# Patient Record
Sex: Female | Born: 1985
Health system: Southern US, Community
[De-identification: ages and names within clinical notes are randomized; demographics above are authoritative.]

## PROBLEM LIST (undated history)

## (undated) DIAGNOSIS — F419 Anxiety disorder, unspecified: Secondary | ICD-10-CM

## (undated) DIAGNOSIS — F988 Other specified behavioral and emotional disorders with onset usually occurring in childhood and adolescence: Secondary | ICD-10-CM

## (undated) HISTORY — DX: Other specified behavioral and emotional disorders with onset usually occurring in childhood and adolescence: F98.8

---
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
## 2006-02-16 ENCOUNTER — Ambulatory Visit: Payer: Self-pay | Admitting: Family Medicine

## 2006-06-28 ENCOUNTER — Ambulatory Visit: Payer: Self-pay | Admitting: Family Medicine

## 2006-11-08 DIAGNOSIS — F988 Other specified behavioral and emotional disorders with onset usually occurring in childhood and adolescence: Secondary | ICD-10-CM | POA: Insufficient documentation

## 2007-02-08 ENCOUNTER — Encounter: Payer: Self-pay | Admitting: Family Medicine

## 2007-02-08 ENCOUNTER — Other Ambulatory Visit: Admission: RE | Admit: 2007-02-08 | Discharge: 2007-02-08 | Payer: Self-pay | Admitting: Family Medicine

## 2007-02-08 ENCOUNTER — Ambulatory Visit: Payer: Self-pay | Admitting: Family Medicine

## 2007-02-08 DIAGNOSIS — R82998 Other abnormal findings in urine: Secondary | ICD-10-CM | POA: Insufficient documentation

## 2007-02-08 LAB — CONVERTED CEMR LAB
Beta hcg, urine, semiquantitative: NEGATIVE
Bilirubin Urine: NEGATIVE
Blood in Urine, dipstick: NEGATIVE
Glucose, Urine, Semiquant: NEGATIVE
Ketones, urine, test strip: NEGATIVE
Nitrite: NEGATIVE
Protein, U semiquant: NEGATIVE
Specific Gravity, Urine: 1.015
Urobilinogen, UA: NEGATIVE
pH: 6

## 2007-02-11 ENCOUNTER — Encounter (INDEPENDENT_AMBULATORY_CARE_PROVIDER_SITE_OTHER): Payer: Self-pay | Admitting: *Deleted

## 2007-02-14 ENCOUNTER — Ambulatory Visit: Payer: Self-pay | Admitting: Family Medicine

## 2007-02-18 ENCOUNTER — Telehealth (INDEPENDENT_AMBULATORY_CARE_PROVIDER_SITE_OTHER): Payer: Self-pay | Admitting: *Deleted

## 2007-02-18 LAB — CONVERTED CEMR LAB

## 2007-02-21 LAB — CONVERTED CEMR LAB
ALT: 10 units/L (ref 0–35)
AST: 17 units/L (ref 0–37)
Albumin: 3.4 g/dL — ABNORMAL LOW (ref 3.5–5.2)
Alkaline Phosphatase: 36 units/L — ABNORMAL LOW (ref 39–117)
BUN: 7 mg/dL (ref 6–23)
Basophils Absolute: 0 10*3/uL (ref 0.0–0.1)
Basophils Relative: 0.4 % (ref 0.0–1.0)
Bilirubin, Direct: 0.1 mg/dL (ref 0.0–0.3)
CO2: 30 meq/L (ref 19–32)
Calcium: 8.9 mg/dL (ref 8.4–10.5)
Chloride: 106 meq/L (ref 96–112)
Cholesterol: 200 mg/dL (ref 0–200)
Creatinine, Ser: 0.8 mg/dL (ref 0.4–1.2)
Eosinophils Absolute: 0.1 10*3/uL (ref 0.0–0.6)
Eosinophils Relative: 0.8 % (ref 0.0–5.0)
GFR calc Af Amer: 118 mL/min
GFR calc non Af Amer: 97 mL/min
Glucose, Bld: 95 mg/dL (ref 70–99)
HCT: 37.2 % (ref 36.0–46.0)
HDL: 62.8 mg/dL (ref >39.0)
Hemoglobin: 13 g/dL (ref 12.0–15.0)
LDL Cholesterol: 126 mg/dL — ABNORMAL HIGH (ref 0–99)
Lymphocytes Relative: 21.1 % (ref 12.0–46.0)
MCHC: 34.9 g/dL (ref 30.0–36.0)
MCV: 89.7 fL (ref 78.0–100.0)
Monocytes Absolute: 0.4 10*3/uL (ref 0.2–0.7)
Monocytes Relative: 4.1 % (ref 3.0–11.0)
Neutro Abs: 7.5 10*3/uL (ref 1.4–7.7)
Neutrophils Relative %: 73.6 % (ref 43.0–77.0)
Platelets: 264 10*3/uL (ref 150–400)
Potassium: 4.2 meq/L (ref 3.5–5.1)
RBC: 4.14 M/uL (ref 3.87–5.11)
RDW: 11.6 % (ref 11.5–14.6)
Sodium: 142 meq/L (ref 135–145)
TSH: 2.82 microintl units/mL (ref 0.35–5.50)
Total Bilirubin: 0.7 mg/dL (ref 0.3–1.2)
Total CHOL/HDL Ratio: 3.2
Total Protein: 6.5 g/dL (ref 6.0–8.3)
Triglycerides: 58 mg/dL (ref 0–149)
VLDL: 12 mg/dL (ref 0–40)
WBC: 10.2 10*3/uL (ref 4.5–10.5)

## 2007-04-18 ENCOUNTER — Telehealth (INDEPENDENT_AMBULATORY_CARE_PROVIDER_SITE_OTHER): Payer: Self-pay | Admitting: *Deleted

## 2007-04-19 ENCOUNTER — Ambulatory Visit: Payer: Self-pay | Admitting: Family Medicine

## 2007-04-19 DIAGNOSIS — N390 Urinary tract infection, site not specified: Secondary | ICD-10-CM | POA: Insufficient documentation

## 2007-04-19 LAB — CONVERTED CEMR LAB
Beta hcg, urine, semiquantitative: NEGATIVE
Bilirubin Urine: NEGATIVE
Glucose, Urine, Semiquant: NEGATIVE
Ketones, urine, test strip: NEGATIVE
Nitrite: NEGATIVE
Protein, U semiquant: NEGATIVE
Specific Gravity, Urine: 1.015
Urobilinogen, UA: NEGATIVE
pH: 6.5

## 2007-04-22 ENCOUNTER — Encounter (INDEPENDENT_AMBULATORY_CARE_PROVIDER_SITE_OTHER): Payer: Self-pay | Admitting: *Deleted

## 2007-07-18 ENCOUNTER — Ambulatory Visit: Payer: Self-pay | Admitting: Internal Medicine

## 2007-07-18 DIAGNOSIS — L509 Urticaria, unspecified: Secondary | ICD-10-CM | POA: Insufficient documentation

## 2008-02-19 ENCOUNTER — Telehealth (INDEPENDENT_AMBULATORY_CARE_PROVIDER_SITE_OTHER): Payer: Self-pay | Admitting: *Deleted

## 2008-04-02 ENCOUNTER — Other Ambulatory Visit: Admission: RE | Admit: 2008-04-02 | Discharge: 2008-04-02 | Payer: Self-pay | Admitting: Family Medicine

## 2008-04-02 ENCOUNTER — Encounter: Payer: Self-pay | Admitting: Family Medicine

## 2008-04-02 ENCOUNTER — Ambulatory Visit: Payer: Self-pay | Admitting: Family Medicine

## 2008-04-02 LAB — CONVERTED CEMR LAB
Bilirubin Urine: NEGATIVE
Glucose, Urine, Semiquant: NEGATIVE
Ketones, urine, test strip: NEGATIVE
Nitrite: NEGATIVE
Protein, U semiquant: NEGATIVE
Specific Gravity, Urine: 1.025
Urobilinogen, UA: NEGATIVE
pH: 5

## 2008-04-03 ENCOUNTER — Ambulatory Visit: Payer: Self-pay | Admitting: Family Medicine

## 2008-04-03 LAB — CONVERTED CEMR LAB

## 2008-04-06 ENCOUNTER — Encounter (INDEPENDENT_AMBULATORY_CARE_PROVIDER_SITE_OTHER): Payer: Self-pay | Admitting: *Deleted

## 2008-04-09 ENCOUNTER — Telehealth (INDEPENDENT_AMBULATORY_CARE_PROVIDER_SITE_OTHER): Payer: Self-pay | Admitting: *Deleted

## 2008-04-09 ENCOUNTER — Encounter (INDEPENDENT_AMBULATORY_CARE_PROVIDER_SITE_OTHER): Payer: Self-pay | Admitting: *Deleted

## 2008-04-13 LAB — CONVERTED CEMR LAB
ALT: 12 units/L (ref 0–35)
AST: 19 units/L (ref 0–37)
Albumin: 3.7 g/dL (ref 3.5–5.2)
Alkaline Phosphatase: 31 units/L — ABNORMAL LOW (ref 39–117)
BUN: 8 mg/dL (ref 6–23)
Basophils Absolute: 0 10*3/uL (ref 0.0–0.1)
Basophils Relative: 0.2 % (ref 0.0–3.0)
Bilirubin, Direct: 0.1 mg/dL (ref 0.0–0.3)
CO2: 28 meq/L (ref 19–32)
Calcium: 8.9 mg/dL (ref 8.4–10.5)
Chloride: 103 meq/L (ref 96–112)
Cholesterol: 222 mg/dL (ref 0–200)
Creatinine, Ser: 0.8 mg/dL (ref 0.4–1.2)
Direct LDL: 168 mg/dL
Eosinophils Absolute: 0 10*3/uL (ref 0.0–0.7)
Eosinophils Relative: 0.5 % (ref 0.0–5.0)
GFR calc Af Amer: 116 mL/min
GFR calc non Af Amer: 96 mL/min
Glucose, Bld: 89 mg/dL (ref 70–99)
HCT: 42.2 % (ref 36.0–46.0)
HDL: 54.1 mg/dL (ref >39.0)
Hemoglobin: 14 g/dL (ref 12.0–15.0)
Lymphocytes Relative: 19 % (ref 12.0–46.0)
MCHC: 33.2 g/dL (ref 30.0–36.0)
MCV: 93.6 fL (ref 78.0–100.0)
Monocytes Absolute: 0.4 10*3/uL (ref 0.1–1.0)
Monocytes Relative: 4.2 % (ref 3.0–12.0)
Neutro Abs: 7.1 10*3/uL (ref 1.4–7.7)
Neutrophils Relative %: 76.1 % (ref 43.0–77.0)
Platelets: 251 10*3/uL (ref 150–400)
Potassium: 3.8 meq/L (ref 3.5–5.1)
RBC: 4.5 M/uL (ref 3.87–5.11)
RDW: 12.4 % (ref 11.5–14.6)
Sodium: 139 meq/L (ref 135–145)
TSH: 0.95 microintl units/mL (ref 0.35–5.50)
Total Bilirubin: 0.7 mg/dL (ref 0.3–1.2)
Total CHOL/HDL Ratio: 4.1
Total Protein: 7.3 g/dL (ref 6.0–8.3)
Triglycerides: 111 mg/dL (ref 0–149)
VLDL: 22 mg/dL (ref 0–40)
WBC: 9.2 10*3/uL (ref 4.5–10.5)

## 2008-04-14 ENCOUNTER — Encounter (INDEPENDENT_AMBULATORY_CARE_PROVIDER_SITE_OTHER): Payer: Self-pay | Admitting: *Deleted

## 2008-05-14 ENCOUNTER — Ambulatory Visit: Payer: Self-pay | Admitting: Family Medicine

## 2008-09-15 ENCOUNTER — Ambulatory Visit: Payer: Self-pay | Admitting: Family Medicine

## 2008-09-15 DIAGNOSIS — J020 Streptococcal pharyngitis: Secondary | ICD-10-CM | POA: Insufficient documentation

## 2009-01-27 ENCOUNTER — Ambulatory Visit: Payer: Self-pay | Admitting: Family Medicine

## 2009-01-27 DIAGNOSIS — N760 Acute vaginitis: Secondary | ICD-10-CM | POA: Insufficient documentation

## 2009-01-27 LAB — CONVERTED CEMR LAB
Beta hcg, urine, semiquantitative: NEGATIVE
Bilirubin Urine: NEGATIVE
Glucose, Urine, Semiquant: NEGATIVE
Ketones, urine, test strip: NEGATIVE
Nitrite: POSITIVE
Protein, U semiquant: 30
Specific Gravity, Urine: 1.01
Urobilinogen, UA: NEGATIVE
Whiff Test: POSITIVE
pH: 6

## 2009-01-28 ENCOUNTER — Encounter: Payer: Self-pay | Admitting: Family Medicine

## 2009-02-02 LAB — CONVERTED CEMR LAB
Chlamydia, DNA Probe: POSITIVE — AB
GC Probe Amp, Genital: NEGATIVE

## 2009-02-05 ENCOUNTER — Ambulatory Visit: Payer: Self-pay | Admitting: Family Medicine

## 2009-02-05 DIAGNOSIS — A5609 Other chlamydial infection of lower genitourinary tract: Secondary | ICD-10-CM | POA: Insufficient documentation

## 2009-02-19 ENCOUNTER — Ambulatory Visit: Payer: Self-pay | Admitting: Family Medicine

## 2009-02-24 ENCOUNTER — Encounter (INDEPENDENT_AMBULATORY_CARE_PROVIDER_SITE_OTHER): Payer: Self-pay | Admitting: *Deleted

## 2009-02-24 LAB — CONVERTED CEMR LAB
Chlamydia, Swab/Urine, PCR: NEGATIVE
GC Probe Amp, Urine: NEGATIVE

## 2009-06-14 ENCOUNTER — Telehealth (INDEPENDENT_AMBULATORY_CARE_PROVIDER_SITE_OTHER): Payer: Self-pay | Admitting: *Deleted

## 2009-07-27 ENCOUNTER — Ambulatory Visit: Payer: Self-pay | Admitting: Family Medicine

## 2009-07-27 ENCOUNTER — Other Ambulatory Visit: Admission: RE | Admit: 2009-07-27 | Discharge: 2009-07-27 | Payer: Self-pay | Admitting: Family Medicine

## 2009-07-27 DIAGNOSIS — R259 Unspecified abnormal involuntary movements: Secondary | ICD-10-CM | POA: Insufficient documentation

## 2009-07-27 LAB — CONVERTED CEMR LAB
Bilirubin Urine: NEGATIVE
Glucose, Urine, Semiquant: NEGATIVE
Ketones, urine, test strip: NEGATIVE
Nitrite: NEGATIVE
Protein, U semiquant: NEGATIVE
Specific Gravity, Urine: 1.01
Urobilinogen, UA: NEGATIVE
pH: 6

## 2009-07-28 ENCOUNTER — Encounter: Payer: Self-pay | Admitting: Family Medicine

## 2009-07-28 LAB — CONVERTED CEMR LAB: Anti Nuclear Antibody(ANA): NEGATIVE

## 2009-07-29 ENCOUNTER — Encounter (INDEPENDENT_AMBULATORY_CARE_PROVIDER_SITE_OTHER): Payer: Self-pay | Admitting: *Deleted

## 2009-07-29 LAB — CONVERTED CEMR LAB: Pap Smear: NEGATIVE

## 2009-08-04 LAB — CONVERTED CEMR LAB
ALT: 13 units/L (ref 0–35)
AST: 26 units/L (ref 0–37)
Albumin: 3.7 g/dL (ref 3.5–5.2)
Alkaline Phosphatase: 36 units/L — ABNORMAL LOW (ref 39–117)
BUN: 7 mg/dL (ref 6–23)
Basophils Absolute: 0 10*3/uL (ref 0.0–0.1)
Basophils Relative: 0.3 % (ref 0.0–3.0)
Bilirubin, Direct: 0.1 mg/dL (ref 0.0–0.3)
CO2: 27 meq/L (ref 19–32)
Calcium: 9.1 mg/dL (ref 8.4–10.5)
Chloride: 106 meq/L (ref 96–112)
Cholesterol: 205 mg/dL — ABNORMAL HIGH (ref 0–200)
Creatinine, Ser: 0.8 mg/dL (ref 0.4–1.2)
Direct LDL: 140.4 mg/dL
Eosinophils Absolute: 0.1 10*3/uL (ref 0.0–0.7)
Eosinophils Relative: 0.6 % (ref 0.0–5.0)
Folate: 12.6 ng/mL
GFR calc non Af Amer: 94.84 mL/min (ref >60)
Glucose, Bld: 70 mg/dL (ref 70–99)
HCT: 38.3 % (ref 36.0–46.0)
HDL: 67.3 mg/dL (ref >39.00)
Hemoglobin: 12.6 g/dL (ref 12.0–15.0)
Lymphocytes Relative: 22.6 % (ref 12.0–46.0)
Lymphs Abs: 2 10*3/uL (ref 0.7–4.0)
MCHC: 33 g/dL (ref 30.0–36.0)
MCV: 92.3 fL (ref 78.0–100.0)
Monocytes Absolute: 0.3 10*3/uL (ref 0.1–1.0)
Monocytes Relative: 3.6 % (ref 3.0–12.0)
Neutro Abs: 6.5 10*3/uL (ref 1.4–7.7)
Neutrophils Relative %: 72.9 % (ref 43.0–77.0)
Platelets: 230 10*3/uL (ref 150.0–400.0)
Potassium: 3.6 meq/L (ref 3.5–5.1)
RBC: 4.15 M/uL (ref 3.87–5.11)
RDW: 12 % (ref 11.5–14.6)
Sodium: 139 meq/L (ref 135–145)
TSH: 0.92 microintl units/mL (ref 0.35–5.50)
Total Bilirubin: 0.8 mg/dL (ref 0.3–1.2)
Total CHOL/HDL Ratio: 3
Total Protein: 7 g/dL (ref 6.0–8.3)
Triglycerides: 75 mg/dL (ref 0.0–149.0)
VLDL: 15 mg/dL (ref 0.0–40.0)
Vitamin B-12: 313 pg/mL (ref 211–911)
WBC: 8.9 10*3/uL (ref 4.5–10.5)

## 2009-08-25 ENCOUNTER — Ambulatory Visit: Payer: Self-pay | Admitting: Family Medicine

## 2009-08-30 ENCOUNTER — Encounter: Payer: Self-pay | Admitting: Family Medicine

## 2009-09-20 ENCOUNTER — Encounter: Payer: Self-pay | Admitting: Family Medicine

## 2009-11-08 ENCOUNTER — Ambulatory Visit: Payer: Self-pay | Admitting: Family Medicine

## 2009-11-08 DIAGNOSIS — J019 Acute sinusitis, unspecified: Secondary | ICD-10-CM | POA: Insufficient documentation

## 2009-11-24 ENCOUNTER — Ambulatory Visit: Payer: Self-pay | Admitting: Family Medicine

## 2009-11-24 DIAGNOSIS — N6009 Solitary cyst of unspecified breast: Secondary | ICD-10-CM | POA: Insufficient documentation

## 2009-11-25 ENCOUNTER — Encounter: Admission: RE | Admit: 2009-11-25 | Discharge: 2009-11-25 | Payer: Self-pay | Admitting: Family Medicine

## 2010-01-13 ENCOUNTER — Ambulatory Visit: Payer: Self-pay | Admitting: Family Medicine

## 2010-03-22 ENCOUNTER — Ambulatory Visit: Payer: Self-pay | Admitting: Family Medicine

## 2010-03-22 DIAGNOSIS — B354 Tinea corporis: Secondary | ICD-10-CM | POA: Insufficient documentation

## 2010-03-22 DIAGNOSIS — L819 Disorder of pigmentation, unspecified: Secondary | ICD-10-CM | POA: Insufficient documentation

## 2010-05-17 ENCOUNTER — Encounter: Payer: Self-pay | Admitting: Family Medicine

## 2010-05-17 ENCOUNTER — Ambulatory Visit: Payer: Self-pay | Admitting: Family Medicine

## 2010-05-17 DIAGNOSIS — R1319 Other dysphagia: Secondary | ICD-10-CM | POA: Insufficient documentation

## 2010-08-02 NOTE — Letter (Signed)
Summary: Communicable Disease Repot/[facility].  Communicable Disease [facility].   Imported By: Lanelle Bal 02/09/2009 11:55:38  _____________________________________________________________________  External Attachment:    Type:   Image     Comment:   External Document

## 2010-08-02 NOTE — Assessment & Plan Note (Signed)
Summary: CPX--PH   Vital Signs:  Patient Profile:   25 Years Old Female Height:     62.25 inches Weight:      134.6 pounds Temp:     98.0 degrees F oral Pulse rate:   80 / minute Resp:     20 per minute BP sitting:   100 / 80  (left arm)  Pt. in pain?   no  Vitals Entered By: Jeremy Johann CMA (April 02, 2008 9:56 AM)                  PCP:  Laury Axon  Chief Complaint:  cpx, pap, and fasting.  History of Present Illness: P t here for cpe, pap and labs.  No complaints.    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       Single       Never Smoked       Alcohol use-yes       Drug use-no       Regular exercise-yes   Risk Factors:  Tobacco use:  never Passive smoke exposure:  no Drug use:  no HIV high-risk behavior:  no Caffeine use:  1 drinks per day Alcohol use:  yes    Type:  rum    Drinks per day:  <1    Has patient --       Felt need to cut down:  no       Been annoyed by complaints:  no       Felt guilty about drinking:  no       Needed eye opener in the morning:  no    Counseled to quit/cut down alcohol use:  no Exercise:  yes    Times per week:  1    Type:  walk Seatbelt use:  100 % Sun Exposure:  occasionally  Family History Risk Factors:    Family History of MI in females < 59 years old:  no    Family History of MI in males < 69 years old:  no   Review of Systems      See HPI  General      Denies  chills, fatigue, fever, loss of appetite, malaise, sleep disorder, sweats, weakness, and weight loss.  Eyes      Denies blurring, discharge, double vision, eye irritation, eye pain, halos, itching, light sensitivity, red eye, vision loss-1 eye, and vision loss-both eyes.      optho q2y  ENT      Denies decreased hearing, difficulty swallowing, ear discharge, earache, hoarseness, nasal congestion, nosebleeds, postnasal drainage, ringing in ears, sinus pressure, and sore throat.      dentist q72m  CV      Denies bluish discoloration of  lips or nails, chest pain or discomfort, difficulty breathing at night, difficulty breathing while lying down, fainting, fatigue, leg cramps with exertion, lightheadness, near fainting, palpitations, shortness of breath with exertion, swelling of feet, swelling of hands, and weight gain.  Resp      Denies chest discomfort, chest pain with inspiration, cough, coughing up blood, excessive snoring, hypersomnolence, morning headaches, pleuritic, shortness of breath, sputum productive, and wheezing.  GI      Denies abdominal pain, bloody stools, change in bowel habits, constipation, dark tarry stools, diarrhea, excessive appetite, gas, hemorrhoids, indigestion, loss of appetite, nausea, vomiting, vomiting blood, and yellowish skin color.  GU      Denies abnormal vaginal bleeding, decreased libido, discharge, dysuria, genital sores, hematuria, incontinence, nocturia, urinary frequency, and urinary hesitancy.  MS      Denies joint pain, joint redness, joint swelling, loss of strength, low back pain, mid back pain, muscle aches, muscle , cramps, muscle weakness, stiffness, and thoracic pain.  Derm      Denies changes in color of skin, changes in nail beds, dryness, excessive perspiration, flushing, hair loss, insect bite(s), itching, lesion(s), poor wound healing, and rash.  Neuro      Denies brief paralysis, difficulty with concentration, disturbances in  coordination, falling down, headaches, inability to speak, memory loss, numbness, poor balance, seizures, sensation of room spinning, tingling, tremors, visual disturbances, and weakness.  Psych      Denies alternate hallucination ( auditory/visual), anxiety, depression, easily angered, easily tearful, irritability, mental problems, panic attacks, sense of great danger, suicidal thoughts/plans, thoughts of violence, unusual visions or sounds, and thoughts /plans of harming others.  Endo      Denies cold intolerance, excessive hunger, excessive thirst, excessive urination, heat intolerance, polyuria, and weight change.  Heme      Denies abnormal bruising, bleeding, enlarge lymph nodes, fevers, pallor, and skin discoloration.  Allergy      Denies hives or rash, itching eyes, persistent infections, seasonal allergies, and sneezing.  CV      Denies bluish discoloration of lips or nails, chest pain or discomfort, difficulty breathing at night, difficulty breathing while lying down, fainting, fatigue, leg cramps with exertion, lightheadness, near fainting, palpitations, shortness of breath with exertion, swelling of feet, swelling of hands, and weight gain.   Physical Exam  General:     Well-developed,well-nourished,in no acute distress; alert,appropriate and cooperative throughout examination Head:     Normocephalic and atraumatic without obvious abnormalities. No apparent alopecia or balding. Eyes:     vision grossly intact, pupils equal, pupils round, pupils reactive to light, and no injection.   Ears:     External ear exam shows no significant lesions or deformities.  Otoscopic examination reveals clear canals, tympanic membranes are intact bilaterally without bulging, retraction, inflammation or discharge. Hearing is grossly normal bilaterally. Nose:     External nasal examination shows no deformity or inflammation. Nasal mucosa are pink and moist without lesions or exudates. Mouth:      Oral mucosa and oropharynx without lesions or exudates.  Teeth in good repair. Neck:     No deformities, masses, or tenderness noted. Chest Wall:     No deformities, masses, or tenderness noted. Breasts:     No mass, nodules, thickening, tenderness, bulging, retraction, inflamation, nipple discharge or skin changes noted.   Lungs:     Normal respiratory effort, chest expands symmetrically. Lungs are clear to auscultation, no crackles or wheezes. Heart:     normal rate, regular rhythm, and no  murmur.   Abdomen:     Bowel sounds positive,abdomen soft and non-tender without masses, organomegaly or hernias noted. Genitalia:     Pelvic Exam:        External: normal female genitalia without lesions or masses        Vagina: normal without lesions or masses        Cervix: normal without lesions or masses        Adnexa: normal bimanual exam without masses or fullness        Uterus: normal by palpation        Pap smear: performed Msk:     No deformity or scoliosis noted of thoracic or lumbar spine.   Pulses:     R posterior tibial normal, R dorsalis pedis normal, R carotid normal, L popliteal normal, L posterior tibial normal, L dorsalis pedis normal, and L carotid normal.   Extremities:     No clubbing, cyanosis, edema, or deformity noted with normal full range of motion of all joints.   Neurologic:     No cranial nerve deficits noted. Station and gait are normal. Plantar reflexes are down-going bilaterally. DTRs are symmetrical throughout. Sensory, motor and coordinative functions appear intact. Skin:     Intact without suspicious lesions or rashes Cervical Nodes:     No lymphadenopathy noted Axillary Nodes:     No palpable lymphadenopathy Psych:     Cognition and judgment appear intact. Alert and cooperative with normal attention span and concentration. No apparent delusions, illusions, hallucinations    Impression & Recommendations:  Problem # 1:  PREVENTIVE HEALTH CARE  (ICD-V70.0) BCP rx given rto for ppd--- flu shot refused Orders: Venipuncture (41660) TLB-Lipid Panel (80061-LIPID) TLB-BMP (Basic Metabolic Panel-BMET) (80048-METABOL) TLB-CBC Platelet - w/Differential (85025-CBCD) TLB-Hepatic/Liver Function Pnl (80076-HEPATIC) TLB-TSH (Thyroid Stimulating Hormone) (84443-TSH) T-RPR (Syphilis) (63016-01093) T- * Misc. Laboratory test [phone])   Complete Medication List: 1)  Ortho Tri-cyclen (28) 0.035 Mg Tabs (Norgestimate-ethinyl estradiol) .Marland Kitchen.. 1 once daily 2)  Fexofenadine Hcl 180 Mg Tabs (Fexofenadine hcl) .Marland Kitchen.. 1 once daily prn  Other Orders: Specimen Handling (32202) UA Dipstick w/o Micro (manual) (81002) T-Culture, Urine ([id number])    Prescriptions: ORTHO TRI-CYCLEN (28) 0.035 MG TABS (NORGESTIMATE-ETHINYL ESTRADIOL) 1 once daily  #1 pack x 11   Entered and Authorized by:   Loreen Freud DO   Signed by:   Loreen Freud DO on 04/02/2008   Method used:   Electronically to        [facility]* (retail)       [address] [place], [place]  [address]       Ph: [phone]       Fax: [phone]   RxID:   [id number]  ] Laboratory Results   Urine Tests   Date/Time Reported: April 02, 2008 11:23 AM   Routine Urinalysis   Color: straw Appearance: Clear Glucose: negative   (Normal Range: Negative) Bilirubin: negative   (Normal Range: Negative) Ketone: negative   (Normal Range: Negative) Spec. Gravity: 1.025   (Normal Range: 1.003-1.035) Blood: large   (Normal Range: Negative) pH: 5.0   (Normal Range: 5.0-8.0) Protein: negative   (Normal Range: Negative) Urobilinogen: negative   (Normal Range: 0-1) Nitrite: negative   (Normal Range: Negative) Leukocyte Esterace: moderate   (Normal Range: Negative)    Comments: Floydene Flock CMA  April 02, 2008 11:24 AM cx sent

## 2010-08-02 NOTE — Letter (Signed)
Summary: Results Follow up Letter  [place] [facility]  [address] [place], [place] [address]   Phone: [phone]  Fax: [phone]    04/22/2007 MRN: [id number]  Margaret Horton [address] [place], [place]  [address]  Dear [name],  The following are the results of your recent test(s):  Test         Result    Pap Smear:        Normal _____  Not Normal _____ Comments: ______________________________________________________ Cholesterol: LDL(Bad cholesterol):         Your goal is less than:         HDL (Good cholesterol):       Your goal is more than: Comments:  ______________________________________________________ Mammogram:        Normal _____  Not Normal _____ Comments:  ___________________________________________________________________ Hemoccult:        Normal _____  Not normal _______ Comments:    _____________________________________________________________________ Other Tests: FYI- YOU URINE CULTURE INDICATED A UTI, WHICH DR.LOWNE TREATED PROPERLY.    We routinely do not discuss normal results over the telephone.  If you desire a copy of the results, or you have any questions about this information we can discuss them at your next office visit.   Sincerely,

## 2010-08-02 NOTE — Letter (Signed)
Summary: Results Follow up Letter  [place] [facility]  [address] [place], [place] [address]   Phone: [phone]  Fax: [phone]    02/11/2007 MRN: [id number]  Margaret Horton [address] [place], [place]  [address]  Dear [name],  The following are the results of your recent test(s):  Test         Result    Pap Smear:        Normal _____  Not Normal _____ Comments: ______________________________________________________ Cholesterol: LDL(Bad cholesterol):         Your goal is less than:         HDL (Good cholesterol):       Your goal is more than: Comments:  ______________________________________________________ Mammogram:        Normal _____  Not Normal _____ Comments:  ___________________________________________________________________ Hemoccult:        Normal _____  Not normal _______ Comments:    _____________________________________________________________________ Other Tests: URINE CULTURE: NEG !   We routinely do not discuss normal results over the telephone.  If you desire a copy of the results, or you have any questions about this information we can discuss them at your next office visit.   Sincerely,

## 2010-08-02 NOTE — Assessment & Plan Note (Signed)
Summary: FOLLOW UP/SKIN TAG//PH   Vital Signs:  Patient profile:   24 year old female Weight:      128.0 pounds Temp:     98.8 degrees F oral Pulse rate:   84 / minute Pulse rhythm:   regular BP sitting:   112 / 76  (right arm)  Vitals Entered By: Almeta Monas CMA Duncan Dull) (March 22, 2010 10:35 AM) CC: c/o skin changes   History of Present Illness: Pt here c/o changes in skin color on chest and shoulders.  Not itchy.  rash is raised.  Current Medications (verified): 1)  Ortho Tri-Cyclen (28) 0.035 Mg Tabs (Norgestimate-Ethinyl Estradiol) .Marland Kitchen.. 1 Once Daily 2)  Naftin 1 % Crea (Naftifine Hcl) .... Apply To Affected Area Once Daily  Allergies (verified): No Known Drug Allergies  Past History:  Past medical, surgical, family and social histories (including risk factors) reviewed for relevance to current acute and chronic problems.  Past Medical History: Reviewed history from 07/18/2007 and no changes required. ADD UTI urinalysis , abnormal  Past Surgical History: Reviewed history from 02/08/2007 and no changes required. Denies surgical history  Family History: Reviewed history from 07/27/2009 and no changes required. F-Family History Hypertension Lymphoma PGF- htn,  DM Renal tumor--F  Social History: Reviewed history from 02/08/2007 and no changes required. 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 Alert and cooperative with normal attention span and concentration. No apparent delusions, illusions, hallucinations   Impression & Recommendations:  Problem # 1:  TINEA CORPORIS (ICD-110.5) naftin  daily refer to derm  Complete Medication List: 1)  Ortho Tri-cyclen (28) 0.035 Mg Tabs (Norgestimate-ethinyl estradiol) .Marland Kitchen.. 1 once daily 2)  Naftin 1 % Crea (Naftifine hcl) .... Apply to affected area once daily  Other Orders: Dermatology Referral (Derma) Prescriptions: NAFTIN 1 % CREA (NAFTIFINE HCL) apply to affected area once daily  #30 g x 1   Entered and Authorized by:   Loreen Freud DO   Signed by:   Loreen Freud DO on 03/22/2010   Method used:   Electronically to        [facility]* (retail)       [address] [place], [place]  [address]       Ph: [phone]       Fax: [phone]   RxID:   [id number]

## 2010-08-02 NOTE — Assessment & Plan Note (Signed)
Summary: acute cough/cbs   Vital Signs:  Patient Profile:   25 Years Old Female Height:     62.25 inches Weight:      133.25 pounds Temp:     98.3 degrees F oral BP sitting:   120 / 70  Vitals Entered By: Kandice Hams (May 14, 2008 2:52 PM)                 PCP:  Laury Axon  Chief Complaint:  c/o cough non productive for 2 1/2 weeks and Cough.  History of Present Illness:  Cough      This is a 25 year old woman who presents with Cough.  The symptoms began duration > 3 days ago.  Pt here c/o 2 weeks hx of cough.  The patient reports productive cough and wheezing, but denies non-productive cough, pleuritic chest pain, shortness of breath, exertional dyspnea, fever, hemoptysis, and malaise.  Associated symtpoms include cold/URI symptoms, sore throat, and nasal congestion.  The cough is worse with lying down.  Ineffective prior treatments have included OTC cough medication.      Current Allergies: No known allergies      Review of Systems      See HPI   Physical Exam  General:     Well-developed,well-nourished,in no acute distress; alert,appropriate and cooperative throughout examination Ears:     External ear exam shows no significant lesions or deformities.  Otoscopic examination reveals clear canals, tympanic membranes are intact bilaterally without bulging, retraction, inflammation or discharge. Hearing is grossly normal bilaterally. Nose:     no external deformity, mucosal erythema, and mucosal edema.   Mouth:     Oral mucosa and oropharynx without lesions or exudates.  Teeth in good repair. Neck:     No deformities, masses, or tenderness noted. Lungs:     R decreased breath sounds and L decreased breath sounds.   Heart:     normal rate, regular rhythm, and no murmur.   Extremities:     No clubbing, cyanosis, edema, or deformity noted with normal full range of motion of all joints.   Skin:     Intact without suspicious lesions or rashes Psych:     Oriented  X3, memory intact for recent and remote, and normally interactive.      Impression & Recommendations:  Problem # 1:  BRONCHITIS-ACUTE (ICD-466.0)  Take antibiotics and other medications as directed. Encouraged to push clear liquids, get enough rest, and take acetaminophen as needed. To be seen in 5-7 days if no improvement, sooner if worse.  Her updated medication list for this problem includes:    Zithromax Z-pak 250 Mg Tabs (Azithromycin) .Marland Kitchen... 2 by mouth once daily for 1 day then 1 by mouth once daily    Tussionex Pennkinetic Er 8-10 Mg/12ml Lqcr (Chlorpheniramine-hydrocodone) .Marland Kitchen... 1 tsp by mouth at bedtime as needed cough   Complete Medication List: 1)  Ortho Tri-cyclen (28) 0.035 Mg Tabs (Norgestimate-ethinyl estradiol) .Marland Kitchen.. 1 once daily 2)  Fexofenadine Hcl 180 Mg Tabs (Fexofenadine hcl) .Marland Kitchen.. 1 once daily prn 3)  Zithromax Z-pak 250 Mg Tabs (Azithromycin) .... 2 by mouth once daily for 1 day then 1 by mouth once daily 4)  Tussionex Pennkinetic Er 8-10 Mg/25ml Lqcr (Chlorpheniramine-hydrocodone) .Marland Kitchen.. 1 tsp by mouth at bedtime as needed cough    Prescriptions: TUSSIONEX PENNKINETIC ER 8-10 MG/5ML LQCR (CHLORPHENIRAMINE-HYDROCODONE) 1 tsp by mouth at bedtime as needed cough  #6 oz x 0   Entered and Authorized by:   Loreen Freud  DO   Signed by:   Loreen Freud DO on 05/14/2008   Method used:   Print then Give to Patient   RxID:   [id number] ZITHROMAX Z-PAK 250 MG TABS (AZITHROMYCIN) 2 by mouth once daily for 1 day then 1 by mouth once daily  #1 pack x 0   Entered and Authorized by:   Loreen Freud DO   Signed by:   Loreen Freud DO on 05/14/2008   Method used:   Electronically to        [facility]* (retail)       [address] [place], [place]  [address]       Ph: [phone]       Fax: [phone]   RxID:   [id number]  ]

## 2010-08-02 NOTE — Letter (Signed)
Summary: Results Follow-up Letter  [place] [facility]  [address] [place], [place] [address]   Phone: [phone]  Fax: [phone]    04/14/2008        [place]. [place] [address] [place], [place]  [address]  Dear Ms. [place]usso,   The following are the results of your recent test(s):  Test     Result     Pap Smear    Normal_______  Not Normal_____       Comments: _________________________________________________________ Cholesterol LDL(Bad cholesterol):          Your goal is less than:         HDL (Good cholesterol):        Your goal is more than: _________________________________________________________ Other Tests:   _________________________________________________________  Please call for an appointment Or _____PLEASE SEE ATTACHED LABWORK___________________________________________________ _________________________________________________________ _________________________________________________________  Sincerely,  Ardyth Man [place] at [facility]

## 2010-08-02 NOTE — Assessment & Plan Note (Signed)
Summary: sinus infection??//lch   Vital Signs:  Patient profile:   25 year old female Weight:      128 pounds Temp:     100.3 degrees F oral BP sitting:   120 / 80  (left arm)  Vitals Entered By: Doristine Devoid (Nov 08, 2009 11:23 AM) CC: sinus infection?- having some jaw pain and sinus HA   History of Present Illness: 25 yo woman here today for ? sinus infxn.  8 days ago pt developed 'cold sxs'.  last night developed tooth and jaw pain on R.  no fevers.  + nasal congestion, facial pain and pressure.  initially sore throat, ear pain- has resolved.  + sick contacts.  Current Medications (verified): 1)  Ortho Tri-Cyclen (28) 0.035 Mg Tabs (Norgestimate-Ethinyl Estradiol) .Marland Kitchen.. 1 Once Daily  Allergies (verified): No Known Drug Allergies  Review of Systems      See HPI  Physical Exam  General:  Well-developed,well-nourished,in no acute distress; alert,appropriate and cooperative throughout examination Head:  + TTP over R maxillary sinus Eyes:  no injxn or inflammation Ears:  External ear exam shows no significant lesions or deformities.  Otoscopic examination reveals clear canals, tympanic membranes are intact bilaterally without bulging, retraction, inflammation or discharge. Hearing is grossly normal bilaterally. Nose:  + congestion Mouth:  + PND Neck:  No deformities, masses, or tenderness noted. Lungs:  Normal respiratory effort, chest expands symmetrically. Lungs are clear to auscultation, no crackles or wheezes. Heart:  normal rate and no murmur.     Impression & Recommendations:  Problem # 1:  SINUSITIS - ACUTE-NOS (ICD-461.9) Assessment New pt's sxs and PE consistent w/ sinus infxn.  start high dose amox.  reviewed supportive care and red flags that should prompt return.  Pt expresses understanding and is in agreement w/ this plan. Her updated medication list for this problem includes:    Amoxicillin 500 Mg Tabs (Amoxicillin) .Marland Kitchen... 2 tabs by mouth two times a day x10  days  Complete Medication List: 1)  Ortho Tri-cyclen (28) 0.035 Mg Tabs (Norgestimate-ethinyl estradiol) .Marland Kitchen.. 1 once daily 2)  Amoxicillin 500 Mg Tabs (Amoxicillin) .... 2 tabs by mouth two times a day x10 days 3)  Fluconazole 150 Mg Tabs (Fluconazole) .Marland Kitchen.. 1 tab as needed for yeast.  may repeat in 3 days if symptoms continue  Patient Instructions: 1)  This appears to be a sinus infection 2)  Take the Amoxicillin as directed- take w/ food to avoid upset stomach 3)  Get the Fluconazole only if you develop a yeast infection 4)  Drink plenty of fluid 5)  Tylenol/Ibuprofen for fevers/pain 6)  Hang in there!! Prescriptions: FLUCONAZOLE 150 MG TABS (FLUCONAZOLE) 1 tab as needed for yeast.  may repeat in 3 days if symptoms continue  #2 x 0   Entered and Authorized by:   Neena Rhymes MD   Signed by:   Neena Rhymes MD on 11/08/2009   Method used:   Electronically to        [facility]* (retail)       [address] [place], [place]  [address]       Ph: [phone]       Fax: [phone]   RxID:   [id number] AMOXICILLIN 500 MG TABS (AMOXICILLIN) 2 tabs by mouth two times a day x10 days  #40 x 0   Entered and Authorized by:   Neena Rhymes MD  Signed by:   Neena Rhymes MD on 11/08/2009   Method used:   Electronically to        [facility]* (retail)       [address] [place], [place]  [address]       Ph: [phone]       Fax: [phone]   RxID:   [id number]

## 2010-08-02 NOTE — Assessment & Plan Note (Signed)
Summary: F/U on Medication/scm/n/s   Vital Signs:  Patient profile:   25 year old female Height:      62.25 inches Weight:      137 pounds Temp:     99.6 degrees F oral Pulse rate:   78 / minute BP sitting:   110 / 78  (left arm)  Vitals Entered By: Jeremy Johann CMA (February 19, 2009 2:57 PM) CC: f/u med   History of Present Illness: Pt here to recheck chlamydia.  Pt finished meds.  No complaints.    Current Medications (verified): 1)  Ortho Tri-Cyclen (28) 0.035 Mg Tabs (Norgestimate-Ethinyl Estradiol) .Marland Kitchen.. 1 Once Daily  Allergies (verified): No Known Drug Allergies  Past History:  Past medical, surgical, family and social histories (including risk factors) reviewed, and no changes noted (except as noted below).  Past Medical History: Reviewed history from 07/18/2007 and no changes required. ADD UTI urinalysis , abnormal  Past Surgical History: Reviewed history from 02/08/2007 and no changes required. Denies surgical history  Family History: Reviewed history from 02/08/2007 and no changes required. F-Family History Hypertension Lymphoma PGF- htn, DM  Social History: Reviewed history from 02/08/2007 and no changes required. 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 Single Never Smoked Alcohol use-yes Drug use-no Regular exercise-yes  Review of Systems      See HPI   Impression & Recommendations:  Problem # 1:  CHLAMYDTRACHOMATIS INFECTION LOWER GU SITES (ICD-099.53)  Orders: T-Chlamydia  Probe, urine (25366-44034) T-GC Probe, urine (74259-56387)  Complete Medication List: 1)  Ortho Tri-cyclen (28) 0.035 Mg Tabs (Norgestimate-ethinyl estradiol) .Marland Kitchen.. 1 once daily

## 2010-08-02 NOTE — Assessment & Plan Note (Signed)
Summary: chest hurts after eating thimks pill got stuck/cbs   Vital Signs:  Patient profile:   25 year old female Height:      62.25 inches Weight:      131.0 pounds BMI:     23.85 Pulse rate:   80 / minute Pulse rhythm:   regular BP sitting:   100 / 60  (left arm) Cuff size:   regular  Vitals Entered By: Almeta Monas CMA Duncan Dull) (May 17, 2010 1:35 PM) CC: c/o chest pain x2weeks-- worse with eating    History of Present Illness: Pt here c/o chest pain with eating or drinking  for 2 weeks.  No otc meds trieds.   No sob, dizziness or palpatations.  Pt was taking abx for acne and that is when it started.     Preventive Screening-Counseling & Management  Alcohol-Tobacco     Alcohol drinks/day: <1     Alcohol type: rum     Smoking Status: never     Passive Smoke Exposure: no  Caffeine-Diet-Exercise     Caffeine use/day: 1     Does Patient Exercise: yes     Type of exercise: walk     Times/week: 1  Current Medications (verified): 1)  Ortho Tri-Cyclen (28) 0.035 Mg Tabs (Norgestimate-Ethinyl Estradiol) .Marland Kitchen.. 1 Once Daily 2)  Naftin 1 % Crea (Naftifine Hcl) .... Apply To Affected Area Once Daily 3)  Nexium 40 Mg Cpdr (Esomeprazole Magnesium) .Marland Kitchen.. 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 F-Family History Hypertension Lymphoma PGF- htn,  DM Renal tumor--F  Social History: Reviewed history from 02/08/2007 and no changes required. 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 Single Never Smoked Alcohol use-yes Drug use-no Regular exercise-yes  Review of Systems      See HPI  Physical Exam  General:  Well-developed,well-nourished,in no acute distress; alert,appropriate and cooperative throughout examination Neck:  No deformities, masses, or tenderness noted. Lungs:  Normal respiratory effort, chest expands symmetrically. Lungs are clear to auscultation, no crackles or wheezes. Heart:  Normal rate and regular rhythm. S1 and S2 normal without gallop, murmur, click, rub or other extra sounds. Abdomen:  Bowel sounds positive,abdomen soft and non-tender without masses, organomegaly or hernias noted. Psych:  Cognition and judgment appear intact. Alert and cooperative with normal attention span and concentration. No apparent delusions, illusions, hallucinations   Impression & Recommendations:  Problem # 1:  OTHER DYSPHAGIA (ICD-787.29) nexium 40 mg 1po once daily  if no better consider GI  Complete Medication List: 1)  Ortho Tri-cyclen (28) 0.035 Mg Tabs (Norgestimate-ethinyl estradiol) .Marland Kitchen.. 1 once daily 2)  Naftin 1 % Crea (Naftifine hcl) .... Apply to affected area once daily 3)  Nexium 40 Mg Cpdr (Esomeprazole magnesium) .Marland Kitchen.. 1 by mouth once daily  Patient Instructions: 1)  Avoid foods high in acid(tomatoes, citrus juices,spicy foods).Avoid eating within two hours of lying down or before exercising. Do not over eat:  try smaller more frequent meals. Elevate head of bed twelve inches when sleeping.    Orders Added: 1)  Est. Patient Level III [64403]

## 2010-08-02 NOTE — Progress Notes (Signed)
Summary: bladder infection  Phone Note Call from Patient   Caller: Patient Reason for Call: Acute Illness Summary of Call: dr. Laury Axon [phone] pt believes she has a bladder infection. when she uses the restroom it burns and she goes to the restroom more often. she asked to be seen tomorrow by dr Laury Axon. dr. Ernst Spell first available will be this coming up tuesday. Margaret Horton said that she has felt this way since saturday. Initial call taken by: Charolette Child,  April 18, 2007 2:26 PM  Follow-up for Phone Call        spoke with pt who c/o burning with urination, frequency, no blood ov sched for am Follow-up by: Kandice Hams,  April 18, 2007 2:58 PM

## 2010-08-02 NOTE — Progress Notes (Signed)
Summary: ?bcp-lmom  Phone Note Call from Patient Call back at [phone]   Caller: Patient Summary of Call: Patient has ?'s re:BCP -- she is aware that she will not get a call back until 02/20/08  Initial call taken by: Shary Decamp,  February 19, 2008 3:24 PM  Follow-up for Phone Call        left message on machine .........Marland KitchenDoristine Devoid  February 20, 2008 9:25 AM   spoke with patient says she had spotting yesterday that was it not time for cycle nor is she pregnant inform patient that should this continue she needs to contact office to be seen.......Marland KitchenDoristine Devoid  February 20, 2008 12:36 PM

## 2010-08-02 NOTE — Progress Notes (Signed)
Summary: clarification of rx  Phone Note From Pharmacy   Caller: pharmacy  [facility] Call For: CLARIFICATION OF METROGEL  Summary of Call: PHARMACIST SAYS IF RX FOR VAGINAL SHOULD BE 0.75 IN THE METROGEL. OK RX CHANGED TO 0.75 OF METROGEL .Kandice Hams  April 09, 2008 2:44 PM  Initial call taken by: Kandice Hams,  April 09, 2008 2:44 PM    New/Updated Medications: METROGEL-VAGINAL 0.75 % GEL (METRONIDAZOLE) 1 apllication for 5 days   Prescriptions: METROGEL-VAGINAL 0.75 % GEL (METRONIDAZOLE) 1 apllication for 5 days  #1 x 0   Entered by:   Kandice Hams   Authorized by:   Loreen Freud DO   Signed by:   Kandice Hams on 04/09/2008   Method used:   Telephoned to ...       [facility]* (retail)       [address] [place], [place]  [address]       Ph: [phone]       Fax: [phone]   RxID:   [id number]

## 2010-08-02 NOTE — Assessment & Plan Note (Signed)
Summary: review lab/cbs   Vital Signs:  Patient profile:   25 year old female Height:      62.25 inches Weight:      135 pounds Temp:     98.1 degrees F oral Pulse rate:   82 / minute BP sitting:   100 / 78  (left arm)  Vitals Entered By: Jeremy Johann CMA (February 05, 2009 11:20 AM) CC: review labs   History of Present Illness: Pt here for Pap results-----+ chlamydia----no symptoms.    Current Medications (verified): 1)  Ortho Tri-Cyclen (28) 0.035 Mg Tabs (Norgestimate-Ethinyl Estradiol) .Marland Kitchen.. 1 Once Daily 2)  Zithromax 250 Mg Tabs (Azithromycin) .... 4 By Mouth X1  Allergies (verified): No Known Drug Allergies  Past History:  Past medical, surgical, family and social histories (including risk factors) reviewed, and no changes noted (except as noted below).  Past Medical History: Reviewed history from 07/18/2007 and no changes required. ADD UTI urinalysis , abnormal  Past Surgical History: Reviewed history from 02/08/2007 and no changes required. Denies surgical history  Family History: Reviewed history from 02/08/2007 and no changes required. F-Family History Hypertension Lymphoma PGF- htn, DM  Social History: Reviewed history from 02/08/2007 and no changes required. 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     Impression & Recommendations:  Problem # 1:  CHLAMYDTRACHOMATIS INFECTION LOWER GU SITES (ICD-099.53) suprax 400 mg  1 by mouth x1 zithromax 250  #4  1 by mouth x1 reported to HD rto 2 weeks to test for cure pt will notify partner  Complete Medication List: 1)  Ortho Tri-cyclen (28) 0.035 Mg Tabs (Norgestimate-ethinyl estradiol) .Marland Kitchen.. 1 once daily 2)   Zithromax 250 Mg Tabs (Azithromycin) .... 4 by mouth x1 Prescriptions: ZITHROMAX 250 MG TABS (AZITHROMYCIN) 4 by mouth x1  #4 x 0   Entered and Authorized by:   Loreen Freud DO   Signed by:   Loreen Freud DO on 02/05/2009   Method used:   Electronically to        [id number]* (retail)       [address]       [place], [place]  [address]       Ph: [phone]       Fax: [phone]   RxID:   [id number]

## 2010-08-02 NOTE — Assessment & Plan Note (Signed)
Summary: cpx/kdc   Vital Signs:  Patient profile:   25 year old female Height:      62.25 inches Weight:      137 pounds BMI:     24.95 Temp:     98.3 degrees F oral Pulse rate:   82 / minute Pulse rhythm:   regular BP sitting:   122 / 80  (left arm) Cuff size:   regular  Vitals Entered By: Army Fossa CMA (July 27, 2009 1:04 PM) CC: cpe and pap   History of Present Illness: Pt here for cpe and pap.   Pt here with mom---complains of muscle twitching---this has been going on since she was little but it hasn't bothered her until recently---it is getting worse.  It is becoming more noticeable.  Pt is also having headaches.     Preventive Screening-Counseling & Management  Alcohol-Tobacco     Alcohol drinks/day: <1     Alcohol type: rum     Smoking Status: never     Passive Smoke Exposure: no  Caffeine-Diet-Exercise     Caffeine use/day: 1     Does Patient Exercise: yes     Type of exercise: walk     Times/week: 1  Hep-HIV-STD-Contraception     Dental Visit-last 6 months yes     Dental Care Counseling: not indicated; dental care within six months      Drug Use:  never.    Current Medications (verified): 1)  Ortho Tri-Cyclen (28) 0.035 Mg Tabs (Norgestimate-Ethinyl Estradiol) .Marland Kitchen.. 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 F-Family History Hypertension Lymphoma PGF- htn,  DM Renal tumor--F  Social History: Reviewed history from 02/08/2007 and no changes required. 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 Eyes:  Denies blurring, discharge, double vision, eye irritation, eye pain, halos, itching, light sensitivity, red eye, vision loss-1 eye, and vision loss-both eyes; contacts--ophtho q1y. ENT:  Denies decreased hearing, difficulty swallowing, ear discharge, earache, hoarseness, nasal congestion, nosebleeds, postnasal drainage, ringing in ears, sinus pressure, and sore throat. CV:  Denies bluish discoloration of lips or nails, chest pain or discomfort, difficulty breathing at night, difficulty breathing while lying down, fainting, fatigue, leg cramps with exertion, lightheadness, near fainting, palpitations, shortness of breath with exertion, swelling of feet, swelling of hands, and weight gain. Resp:  Denies chest discomfort, chest pain with inspiration, cough, coughing up blood, excessive snoring, hypersomnolence, morning headaches, pleuritic, shortness of breath, sputum productive, and wheezing. GI:  Denies abdominal pain, bloody stools, change in bowel habits, constipation, dark tarry stools, diarrhea, excessive appetite, gas, hemorrhoids, indigestion, loss of appetite, nausea, vomiting, vomiting blood, and yellowish skin color. GU:  Denies abnormal vaginal bleeding, decreased libido, discharge, dysuria, genital sores, hematuria, incontinence, nocturia, urinary frequency, and urinary hesitancy. MS:  Denies joint pain, joint redness, joint swelling, loss of  strength, low back pain, mid back pain, muscle aches, muscle , cramps, muscle weakness, stiffness, and thoracic pain. Derm:  Denies changes in color of skin, changes in nail beds, dryness, excessive perspiration, flushing, hair loss, insect bite(s), itching, lesion(s), poor wound healing, and rash. Neuro:  Denies brief paralysis, difficulty with concentration, disturbances in coordination, falling down, headaches, inability to speak, memory loss, numbness, poor balance, seizures, sensation of room spinning, tingling, tremors, visual disturbances, and weakness. Psych:  Denies alternate hallucination ( auditory/visual), anxiety, depression, easily angered, easily tearful, irritability, mental problems, panic attacks, sense of great danger, suicidal thoughts/plans, thoughts of violence, unusual visions or sounds, and thoughts /plans of harming others. Endo:  Denies cold intolerance, excessive hunger, excessive thirst, excessive urination, heat intolerance, polyuria, and weight change. Heme:  Denies abnormal bruising, bleeding, enlarge lymph nodes, fevers, pallor, and skin discoloration. Allergy:  Denies hives or rash, itching eyes, persistent infections, seasonal allergies, and sneezing.  Physical Exam  General:  Well-developed,well-nourished,in no acute distress; alert,appropriate and cooperative throughout examination Head:  Normocephalic and atraumatic without obvious abnormalities. No apparent alopecia or balding. Eyes:  vision grossly intact, pupils equal, pupils round, pupils reactive to light, and no injection.   Ears:  External ear exam shows no significant lesions or deformities.  Otoscopic examination reveals clear canals, tympanic membranes are intact bilaterally without bulging, retraction, inflammation or discharge. Hearing is grossly normal bilaterally. Nose:  External nasal examination shows no deformity or inflammation. Nasal mucosa are pink and moist without lesions or exudates. Mouth:   Oral mucosa and oropharynx without lesions or exudates.  Teeth in good repair. Neck:  No deformities, masses, or tenderness noted. Chest Wall:  No deformities, masses, or tenderness noted. Breasts:  No mass, nodules, thickening, tenderness, bulging, retraction, inflamation, nipple discharge or skin changes noted.   Lungs:  Normal respiratory effort, chest expands symmetrically. Lungs are clear to auscultation, no crackles or wheezes. Heart:  normal rate and no murmur.   Abdomen:  Bowel sounds positive,abdomen soft and non-tender without masses, organomegaly or hernias noted. Genitalia:  Pelvic Exam:        External: normal female genitalia without lesions or masses        Vagina: normal without lesions or masses        Cervix: normal without lesions or masses        Adnexa: normal bimanual exam without masses or fullness        Uterus: normal by palpation        Pap smear: performed Msk:  normal ROM, no joint tenderness, no joint swelling, no joint warmth, no redness over joints, no joint deformities, no joint instability, and no crepitation.   Pulses:  R radial normal, R posterior tibial normal, R dorsalis pedis normal, R carotid normal, L popliteal normal, L posterior tibial normal, L dorsalis pedis normal, and L carotid normal.   Extremities:  No clubbing, cyanosis, edema, or deformity noted with normal full range of motion of all joints.   Neurologic:  No cranial nerve deficits noted. Station and gait are normal. Plantar reflexes are down-going bilaterally. DTRs are symmetrical throughout. Sensory, motor and coordinative functions appear intact. Skin:  Intact without suspicious lesions or rashes Cervical Nodes:  No lymphadenopathy noted Axillary Nodes:  No palpable lymphadenopathy Psych:  Oriented X3 and normally interactive.  Impression & Recommendations:  Problem # 1:  PREVENTIVE HEALTH CARE (ICD-V70.0) GHM utd refill bcp  Orders: Venipuncture (60454) TLB-Lipid Panel  (80061-LIPID) TLB-BMP (Basic Metabolic Panel-BMET) (80048-METABOL) TLB-CBC Platelet - w/Differential (85025-CBCD) TLB-Hepatic/Liver Function Pnl (80076-HEPATIC) TLB-TSH (Thyroid Stimulating Hormone) (84443-TSH) TLB-B12 + Folate Pnl (09811_91478-G95/AOZ) T-Antinuclear Antib (ANA) (30865-78469) T-HIV Antibody  (Reflex) [id number]) T-RPR (Syphilis) (44010-27253) T- * Misc. Laboratory test [phone])  Problem # 2:  TWITCHING (ICD-781.0)  Orders: TLB-B12 + Folate Pnl (34742_59563-O75/IEP) T-Antinuclear Antib (ANA) (32951-88416) T-HIV Antibody  (Reflex) [id number]) T-RPR (Syphilis) (93235-57322) T- * Misc. Laboratory test [id number]) Neurology Referral (Neuro)  Complete Medication List: 1)  Ortho Tri-cyclen (28) 0.035 Mg Tabs (Norgestimate-ethinyl estradiol) .Marland Kitchen.. 1 once daily  Other Orders: Admin 1st Vaccine (70623) Flu Vaccine 27yrs + (76283) Tdap => 103yrs IM (15176) Admin of Any Addtl Vaccine (16073) UA Dipstick W/ Micro (manual) (81000) Specimen Handling (71062) T-Culture, Urine (69485-46270) Prescriptions: ORTHO TRI-CYCLEN (28) 0.035 MG TABS (NORGESTIMATE-ETHINYL ESTRADIOL) 1 once daily  #1 x 11   Entered and Authorized by:   Loreen Freud DO   Signed by:   Loreen Freud DO on 07/27/2009   Method used:   Electronically to        [facility]* (retail)       [address] [place], [place]  [address]       Ph: [phone]       Fax: [phone]   RxID:   [id number]  Flu Vaccine Consent Questions     Do you have a history of severe allergic reactions to this vaccine? no    Any prior history of allergic reactions to egg and/or gelatin? no    Do you have a sensitivity to the preservative Thimersol? no    Do you have a past history of Guillan-Barre Syndrome? no    Do you currently have an acute febrile illness? no    Have you ever had a severe reaction to latex? no    Vaccine information given and explained to patient? yes    Are you  currently pregnant? no    Lot Number:[id number]   Exp Date:12/30/2009   Site Given Right Deltoid IM  Immunizations Administered:  Tetanus Vaccine:    Vaccine Type: Tdap    Site: left deltoid    Mfr: GlaxoSmithKline    Dose: 0.5 ml    Route: IM    Given by: Army Fossa CMA    Exp. Date: 08/28/2011    Lot #: [id number]   .lbflu   Immunizations Administered:  Tetanus Vaccine:    Vaccine Type: Tdap    Site: left deltoid    Mfr: GlaxoSmithKline    Dose: 0.5 ml    Route: IM    Given by: Army Fossa CMA    Exp. Date: 08/28/2011    Lot #: [id number]   Laboratory Results   Urine Tests   Date/Time Reported: July 27, 2009 2:10 PM    Routine Urinalysis   Color: orange Appearance: Cloudy Glucose: negative   (Normal Range: Negative) Bilirubin: negative   (Normal Range: Negative) Ketone: negative   (Normal Range: Negative) Spec. Gravity: 1.010   (Normal Range: 1.003-1.035) Blood: small   (Normal Range: Negative) pH: 6.0   (Normal Range: 5.0-8.0) Protein: negative   (Normal Range: Negative) Urobilinogen: negative   (Normal Range: 0-1) Nitrite: negative   (Normal Range: Negative) Leukocyte Esterace: small   (Normal Range: Negative)    Comments:  Floydene Flock  July 27, 2009 2:11 PM  cx  sent

## 2010-08-02 NOTE — Letter (Signed)
Summary: Results Follow-up Letter  [place] [facility]  [address] [place], [place] [address]   Phone: [phone]  Fax: [phone]    02/24/2009        [name] [address] [place], [place]  [address]  Dear Ms. Furth,   The following are the results of your recent test(s):  Test     Result     Pap Smear    Normal_______  Not Normal_____       Comments: _________________________________________________________ Cholesterol LDL(Bad cholesterol):          Your goal is less than:         HDL (Good cholesterol):        Your goal is more than: _________________________________________________________ Other Tests:   _________________________________________________________  Please call for an appointment Or Please see attached labs._____________________________________________________ _________________________________________________________ _________________________________________________________  Sincerely,  Felecia Deloach CMA  at [facility]

## 2010-08-02 NOTE — Letter (Signed)
Summary: Staff Medical Report/[facility]  Staff Medical Report/[facility]   Imported By: Lanelle Bal 04/07/2008 09:43:28  _____________________________________________________________________  External Attachment:    Type:   Image     Comment:   External Document

## 2010-08-02 NOTE — Assessment & Plan Note (Signed)
Summary: lump left breast/cbs   Vital Signs:  Patient profile:   25 year old female Weight:      130.38 pounds Pulse rate:   86 / minute Pulse rhythm:   regular BP sitting:   122 / 80  (left arm) Cuff size:   regular  Vitals Entered By: Army Fossa CMA (Nov 24, 2009 10:58 AM) CC: Pt here felt a "strange" lump on left breast, just finished her period.   History of Present Illness: Pt here with mother c/o lump felt in L breast last night.   Pt finished period on Saturday.  Current Medications (verified): 1)  Ortho Tri-Cyclen (28) 0.035 Mg Tabs (Norgestimate-Ethinyl Estradiol) .Marland Kitchen.. 1 Once Daily  Allergies (verified): No Known Drug Allergies  Past History:  Past medical, surgical, family and social histories (including risk factors) reviewed for relevance to current acute and chronic problems.  Past Medical History: Reviewed history from 07/18/2007 and no changes required. ADD UTI urinalysis , abnormal  Past Surgical History: Reviewed history from 02/08/2007 and no changes required. Denies surgical history  Family History: Reviewed history from 07/27/2009 and no changes required. F-Family History Hypertension Lymphoma PGF- htn,  DM Renal tumor--F  Social History: Reviewed history from 02/08/2007 and no changes required. 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     Impression & Recommendations:  Problem # 1:  BREAST CYST, LEFT (ICD-610.0)  Orders: Radiology Referral (Radiology) US breast  Complete Medication List: 1)  Ortho Tri-cyclen (28) 0.035 Mg Tabs (Norgestimate-ethinyl estradiol) .Marland Kitchen.. 1 once daily

## 2010-08-02 NOTE — Letter (Signed)
Summary: Results Follow-up Letter  [place] [facility]  [address] [place], [place] [address]   Phone: [phone]  Fax: [phone]    04/09/2008        Margaret Horton [address] [place], [place]  [address]  Dear [name],   The following are the results of your recent test(s):  Test     Result     Pap Smear    Normal__X_____  Not Normal_____       Comments: _________________________________________________________ Cholesterol LDL(Bad cholesterol):          Your goal is less than:         HDL (Good cholesterol):        Your goal is more than: _________________________________________________________ Other Tests:   _________________________________________________________  Please call for an appointment Or Please see attached pap results._________________________________________________________ _________________________________________________________ _________________________________________________________  Sincerely,  Felecia Deloach CMA  at [facility]

## 2010-08-02 NOTE — Letter (Signed)
Summary: Results Follow up Letter  [place] [facility]  [address] [place], [place] [address]   Phone: [phone]  Fax: [phone]    07/29/2009 MRN: [id number]  Margaret Horton [address] [place], [place]  [address]  Dear [name],  The following are the results of your recent test(s):  Test         Result    Pap Smear:        Normal __X___  Not Normal _____ Comments: ______________________________________________________ Cholesterol: LDL(Bad cholesterol):         Your goal is less than:         HDL (Good cholesterol):       Your goal is more than: Comments:  ______________________________________________________ Mammogram:        Normal _____  Not Normal _____ Comments:  ___________________________________________________________________ Hemoccult:        Normal _____  Not normal _______ Comments:    _____________________________________________________________________ Other Tests:    We routinely do not discuss normal results over the telephone.  If you desire a copy of the results, or you have any questions about this information we can discuss them at your next office visit.   Sincerely,    Army Fossa CMA  July 29, 2009 10:38 AM

## 2010-08-02 NOTE — Assessment & Plan Note (Signed)
Summary: acute - sore throat,temp/cbs   Vital Signs:  Patient profile:   25 year old female Height:      62.25 inches Weight:      125 pounds BMI:     22.76 BSA:     1.57 Temp:     99.8 degrees F oral Pulse rate:   74 / minute Resp:     18 per minute BP sitting:   100 / 64  (left arm)  Vitals Entered By: Ardyth Man (September 15, 2008 11:43 AM) Is Patient Diabetic? No Pain Assessment Patient in pain? no       Have you ever been in a relationship where you felt threatened, hurt or afraid?No   Does patient need assistance? Functional Status Self care   History of Present Illness: Pt here with mom c/o sore throat for 2 days and fever.  No otc.  No other complaints.  Problems Prior to Update: 1)  Urticaria  (ICD-708.9) 2)  Uti  (ICD-599.0) 3)  Urinalysis, Abnormal  (ICD-791.9) 4)  Preventive Health Care  (ICD-V70.0) 5)  Add  (ICD-314.00)  Medications Prior to Update: 1)  Ortho Tri-Cyclen (28) 0.035 Mg Tabs (Norgestimate-Ethinyl Estradiol) .Marland Kitchen.. 1 Once Daily 2)  Fexofenadine Hcl 180 Mg  Tabs (Fexofenadine Hcl) .Marland Kitchen.. 1 Once Daily Prn 3)  Tussionex Pennkinetic Er 8-10 Mg/9ml Lqcr (Chlorpheniramine-Hydrocodone) .Marland Kitchen.. 1 Tsp By Mouth At Bedtime As Needed Cough  Current Medications (verified): 1)  Ortho Tri-Cyclen (28) 0.035 Mg Tabs (Norgestimate-Ethinyl Estradiol) .Marland Kitchen.. 1 Once Daily 2)  Fexofenadine Hcl 180 Mg  Tabs (Fexofenadine Hcl) .Marland Kitchen.. 1 Once Daily Prn 3)  Tussionex Pennkinetic Er 8-10 Mg/46ml Lqcr (Chlorpheniramine-Hydrocodone) .Marland Kitchen.. 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    Single    Never Smoked    Alcohol use-yes    Drug use-no    Regular exercise-yes     (02/08/2007)  Risk Factors:    Alcohol Use: <1 (02/08/2007)    >5 drinks/d w/in last 3  months: N/A    Caffeine Use: 1 (02/08/2007)    Diet: N/A    Exercise: yes (02/08/2007)  Risk Factors:    Smoking Status: never (02/08/2007)    Packs/Day: N/A    Cigars/wk: N/A    Pipe Use/wk: N/A    Cans of tobacco/wk: N/A    Passive Smoke Exposure: no (02/08/2007)  Past medical, surgical, family and social histories (including risk factors) reviewed, and no changes noted (except as noted below).  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       Single       Never Smoked  Alcohol use-yes       Drug use-no       Regular exercise-yes  Review of Systems      See HPI General:  Denies chills, fatigue, fever, loss of appetite, malaise, sleep disorder, sweats, weakness, and weight loss. ENT:  Complains of sinus pressure and sore throat; denies decreased hearing, difficulty swallowing, ear discharge, earache, hoarseness, nasal congestion, nosebleeds, postnasal drainage, and ringing in ears. CV:  Denies bluish discoloration of lips or nails, chest pain or discomfort, difficulty breathing at night, difficulty breathing while lying down, fainting, fatigue, leg cramps with exertion, lightheadness, near fainting, palpitations, shortness of breath with exertion, swelling of feet, swelling of hands, and weight gain. Resp:  Denies chest discomfort, chest pain with inspiration, cough, coughing up blood, excessive snoring, hypersomnolence, morning headaches, pleuritic, shortness of breath, sputum productive, and wheezing. Derm:   Denies changes in color of skin, changes in nail beds, dryness, excessive perspiration, flushing, hair loss, insect bite(s), itching, lesion(s), poor wound healing, and rash.  Physical Exam  General:  Well-developed,well-nourished,in no acute distress; alert,appropriate and cooperative throughout examination Ears:  External ear exam shows no significant lesions or deformities.  Otoscopic examination reveals clear canals, tympanic membranes are intact bilaterally without bulging, retraction, inflammation or discharge. Hearing is grossly normal bilaterally. Nose:  External nasal examination shows no deformity or inflammation. Nasal mucosa are pink and moist without lesions or exudates. Mouth:  pharyngeal erythema and pharyngeal exudate.   Neck:  supple and cervical lymphadenopathy.   Lungs:  Normal respiratory effort, chest expands symmetrically. Lungs are clear to auscultation, no crackles or wheezes. Heart:  normal rate and regular rhythm.   Skin:  Intact without suspicious lesions or rashes Psych:  Cognition and judgment appear intact. Alert and cooperative with normal attention span and concentration. No apparent delusions, illusions, hallucinations   Impression & Recommendations:  Problem # 1:  STREPTOCOCCAL SORE THROAT (ICD-034.0)  Instructed to complete antibiotics and call if not improved in 48 hours.   Orders: Rapid Strep (56213) Bicillin LA 1.2 million units Injection (J0570) Admin of Therapeutic Inj  intramuscular or subcutaneous (08657)  Complete Medication List: 1)  Ortho Tri-cyclen (28) 0.035 Mg Tabs (Norgestimate-ethinyl estradiol) .Marland Kitchen.. 1 once daily 2)  Fexofenadine Hcl 180 Mg Tabs (Fexofenadine hcl) .Marland Kitchen.. 1 once daily prn 3)  Tussionex Pennkinetic Er 8-10 Mg/11ml Lqcr (Chlorpheniramine-hydrocodone) .Marland Kitchen.. 1 tsp by mouth at bedtime as needed cough   Medication Administration  Injection # 1:    Medication: Bicillin LA 1.2 million units Injection    Diagnosis: STREPTOCOCCAL  SORE THROAT (ICD-034.0)    Route: IM    Site: RUOQ gluteus    Exp Date: 04/01/2009    Lot #: [id number]    Mfr: Brooke Dare    Patient tolerated injection without complications    Given by: Ardyth Man (September 15, 2008 11:59 AM)  Orders Added: 1)  Est. Patient Level III [29528] 2)  Rapid Strep [41324] 3)  Bicillin LA 1.2 million units Injection [J0570] 4)  Admin of Therapeutic Inj  intramuscular or subcutaneous [40102]

## 2010-08-02 NOTE — Miscellaneous (Signed)
Summary: Orders Update  Clinical Lists Changes  Orders: Added new Test order of T-GC Probe, genital [phone]) - Signed Added new Test order of T-Chlamydia Probe, genital [phone]) - Signed

## 2010-08-02 NOTE — Assessment & Plan Note (Signed)
Summary: PAP ONLY.CBS   Vital Signs:  Patient Profile:   25 Years Old Female Height:     62.25 inches Weight:      137.8 pounds Temp:     99.0 degrees F oral Pulse rate:   72 / minute Resp:     20 per minute BP sitting:   110 / 70  (left arm) Cuff size:   regular  Vitals Entered By: Shonna Chock (February 08, 2007 8:21 AM)               PCP:  Laury Axon  Chief Complaint:  CPX WITH PAP.  History of Present Illness: Pt here for CPE and Pap and labs.  No complaints.  Pt no longer on ADD meds.       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    Single    Never Smoked    Alcohol use-yes    Drug use-no    Regular exercise-yes   Risk Factors:  Tobacco use:  never Passive smoke exposure:  no Drug use:  no HIV high-risk behavior:  no Caffeine use:  1 drinks per day Alcohol use:  yes    Type:  rum    Drinks per day:  <1    Has patient --       Felt need to cut down:  no       Been annoyed by complaints:  no       Felt guilty about drinking:  no       Needed eye opener in the morning:  no    Counseled to quit/cut down alcohol use:  no Exercise:  yes    Times per week:  1    Type:  walk Seatbelt use:  100 % Sun Exposure:  occasionally  Family History Risk Factors:    Family History of MI in females < 72 years old:  no    Family History of MI in males < 64 years old:  no   Review of Systems      See HPI  General      Denies chills, fatigue, fever, loss of appetite, malaise, sleep disorder, sweats, weakness, and weight loss.  Eyes      Denies blurring, discharge, double vision, eye irritation, eye pain, halos, itching, light sensitivity, red eye, vision loss-1 eye, and vision loss-both eyes.      ophtho- q year  ENT      Denies decreased hearing, difficulty swallowing, ear discharge, earache, hoarseness, nasal  congestion, nosebleeds, postnasal drainage, ringing in ears, sinus pressure, and sore throat.      dentist q78yr  CV      Denies bluish discoloration of lips or nails, chest pain or discomfort, difficulty breathing at night, difficulty breathing while lying down, fainting, fatigue, leg cramps with exertion, lightheadness, near fainting, palpitations, shortness of breath with exertion, swelling of feet, swelling of hands, and weight gain.  Resp      Denies chest discomfort, chest pain with inspiration, cough, coughing up blood, excessive snoring, hypersomnolence, morning headaches, pleuritic, shortness of breath, sputum productive, and wheezing.  GI      Denies abdominal pain, bloody stools, change in bowel habits, constipation, dark tarry stools, diarrhea, excessive appetite, gas, hemorrhoids, indigestion, loss of appetite, nausea, vomiting, vomiting blood, and  yellowish skin color.  GU      Denies abnormal vaginal bleeding, decreased libido, discharge, dysuria, genital sores, hematuria, incontinence, nocturia, urinary frequency, and urinary hesitancy.  MS      Denies joint pain, joint redness, joint swelling, loss of strength, low back pain, mid back pain, muscle aches, muscle , cramps, muscle weakness, stiffness, and thoracic pain.  Derm      Denies changes in color of skin, changes in nail beds, dryness, excessive perspiration, flushing, hair loss, insect bite(s), itching, lesion(s), poor wound healing, and rash.  Neuro      Denies brief paralysis, difficulty with concentration, disturbances in coordination, falling down, headaches, inability to speak, memory loss, numbness, poor balance, seizures, sensation of room spinning, tingling, tremors, visual disturbances, and weakness.  Psych      Denies alternate hallucination ( auditory/visual), anxiety, depression, easily angered, easily tearful, irritability, mental problems, panic attacks, sense of great danger, suicidal thoughts/plans,  thoughts of violence, unusual visions or sounds, and thoughts /plans of harming others.  Endo      Denies cold intolerance, excessive hunger, excessive thirst, excessive urination, heat intolerance, polyuria, and weight change.  Heme      Denies abnormal bruising, bleeding, enlarge lymph nodes, fevers, pallor, and skin discoloration.  Allergy      Denies hives or rash, itching eyes, persistent infections, seasonal allergies, and sneezing.   Physical Exam  General:     Well-developed,well-nourished,in no acute distress; alert,appropriate and cooperative throughout examination Head:     Normocephalic and atraumatic without obvious abnormalities. No apparent alopecia or balding. Eyes:     vision grossly intact, pupils equal, pupils round, pupils reactive to light, and no injection.   Ears:     External ear exam shows no significant lesions or deformities.  Otoscopic examination reveals clear canals, tympanic membranes are intact bilaterally without bulging, retraction, inflammation or discharge. Hearing is grossly normal bilaterally. Nose:     External nasal examination shows no deformity or inflammation. Nasal mucosa are pink and moist without lesions or exudates. Mouth:     Oral mucosa and oropharynx without lesions or exudates.  Teeth in good repair. Neck:     No deformities, masses, or tenderness noted. Chest Wall:     No deformities, masses, or tenderness noted. Breasts:     No mass, nodules, thickening, tenderness, bulging, retraction, inflamation, nipple discharge or skin changes noted.   Lungs:     Normal respiratory effort, chest expands symmetrically. Lungs are clear to auscultation, no crackles or wheezes. Heart:     normal rate, regular rhythm, and no murmur.   Abdomen:     Bowel sounds positive,abdomen soft and non-tender without masses, organomegaly or hernias noted. Genitalia:     Pelvic Exam:        External: normal female genitalia without lesions or masses         Vagina: normal without lesions or masses        Cervix: normal without lesions or masses        Adnexa: normal bimanual exam without masses or fullness        Uterus: normal by palpation        Pap smear: performed Msk:     normal ROM, no joint tenderness, no joint swelling, no joint warmth, no redness over joints, no joint deformities, no joint instability, no crepitation, and no muscle atrophy.   Pulses:     R carotid normal and L carotid normal.   Extremities:  No clubbing, cyanosis, edema, or deformity noted with normal full range of motion of all joints.   Neurologic:     No cranial nerve deficits noted. Station and gait are normal. Plantar reflexes are down-going bilaterally. DTRs are symmetrical throughout. Sensory, motor and coordinative functions appear intact. Skin:     Intact without suspicious lesions or rashes Cervical Nodes:     No lymphadenopathy noted Axillary Nodes:     No palpable lymphadenopathy Psych:     Cognition and judgment appear intact. Alert and cooperative with normal attention span and concentration. No apparent delusions, illusions, hallucinations    Impression & Recommendations:  Problem # 1:  PREVENTIVE HEALTH CARE (ICD-V70.0) refill BCP check fasting labs antic. guidance sbe Orders: UA Dipstick w/o Micro (81002)   Complete Medication List: 1)  Ortho Tri-cyclen (28) 0.035 Mg Tabs (Norgestimate-ethinyl estradiol) .Marland Kitchen.. 1 once daily  Other Orders: T-Culture, Urine ([id number])   Patient Instructions: 1)  RTO fasting labs  V70. 0  cbcd, bmp, hep, tsh, lipid, rpr, hsv2 glycoprotein     Prescriptions: ORTHO TRI-CYCLEN (28) 0.035 MG TABS (NORGESTIMATE-ETHINYL ESTRADIOL) 1 once daily  #1 pack x 11   Entered and Authorized by:   Loreen Freud DO   Signed by:   Loreen Freud DO on 02/08/2007   Method used:   Print then Give to Patient   RxID:   [id number]        Tetanus/Td Immunization History:    Tetanus/Td # 1:  Historical  (08/27/1999)  Hepatitis A Immunization History:    Hep A # 1:  HepA (08/03/2005)    Hep A # 2:  HepA (02/16/2006)  Other Immunization History:    HPV # 1:  Gardasil (11/02/2005)    HPV # 2:  Gardasil (02/16/2006)    HPV # 3:  Gardasil (06/28/2006)  Laboratory Results   Urine Tests   Date/Time Reported: February 08, 2007 10:15 AM   Routine Urinalysis   Glucose: negative   (Normal Range: Negative) Bilirubin: negative   (Normal Range: Negative) Ketone: negative   (Normal Range: Negative) Spec. Gravity: 1.015   (Normal Range: 1.003-1.035) Blood: negative   (Normal Range: Negative) pH: 6.0   (Normal Range: 5.0-8.0) Protein: negative   (Normal Range: Negative) Urobilinogen: negative   (Normal Range: 0-1) Nitrite: negative   (Normal Range: Negative) Leukocyte Esterace: small   (Normal Range: Negative)    Urine HCG: negative

## 2010-08-02 NOTE — Letter (Signed)
Summary: Results Follow-up Letter  [place] [facility]  [address] [place], [place] [address]   Phone: [phone]  Fax: [phone]    04/06/2008 Margaret Horton [address] [place], [place]  [address]  Dear [name],   The following are the results of your recent test(s):  Test     Result     Pap Smear    Normal_______  Not Normal_____       Comments: _________________________________________________________ Cholesterol LDL(Bad cholesterol):          Your goal is less than:         HDL (Good cholesterol):        Your goal is more than: _________________________________________________________ Other Tests:   _________________________________________________________  Please call for an appointment Or Please see attached labs._________________________________________________________ _________________________________________________________ _________________________________________________________  Sincerely,  Felecia Deloach CMA [place] [facility]

## 2010-08-02 NOTE — Progress Notes (Signed)
  Phone Note Outgoing Call   Call placed by: Jeremy Johann CMA,  April 09, 2008 1:08 PM Summary of Call: pt aware of results, lab mailed, rx sent to pharmacy Initial call taken by: [facility] CMA,  April 09, 2008 1:10 PM    New/Updated Medications: METROGEL 1 % GEL (METRONIDAZOLE) 1 applicator at bedtime for 5 days   Prescriptions: METROGEL 1 % GEL (METRONIDAZOLE) 1 applicator at bedtime for 5 days  #1 x 0   Entered by:   Jeremy Johann CMA   Authorized by:   Loreen Freud DO   Signed by:   Jeremy Johann CMA on 04/09/2008   Method used:   Electronically to        [facility]* (retail)       [address] [place], [place]  [address]       Ph: [phone]       Fax: [phone]   RxID:   [id number]

## 2010-08-02 NOTE — Progress Notes (Signed)
Summary: ortho tri cyclen  Phone Note Refill Request Message from:  Fax from Pharmacy on June 14, 2009 3:02 PM  Refills Requested: Medication #1:  ORTHO TRI-CYCLEN (28) 0.035 MG TABS 1 once daily. Initial call taken by: Doristine Devoid,  June 14, 2009 3:02 PM    Prescriptions: ORTHO TRI-CYCLEN (28) 0.035 MG TABS (NORGESTIMATE-ETHINYL ESTRADIOL) 1 once daily  #28 Each x 0   Entered by:   Doristine Devoid   Authorized by:   Loreen Freud DO   Signed by:   Doristine Devoid on 06/14/2009   Method used:   Electronically to        [facility]* (retail)       [address] [place], [place]  [address]       Ph: [phone]       Fax: [phone]   RxID:   [id number]

## 2010-08-02 NOTE — Assessment & Plan Note (Signed)
Summary: itching on the whole body, that will bleed--acute only//tl   Vital Signs:  Patient Profile:   25 Years Old Female Height:     62.25 inches Weight:      135 pounds Pulse rate:   64 / minute Pulse rhythm:   regular Resp:     12 per minute BP sitting:   122 / 70  (left arm) Cuff size:   large  Vitals Entered By: Wendall Stade (July 18, 2007 3:58 PM)                 PCP:  Laury Axon  Chief Complaint:  itching all over.  History of Present Illness: When she scratches it makes a bump and the itching is worse. Sx since 06/19/07 , initially better with cocoa butter. Recurrence since 07/13/07 w/o definite trigger; no new detergent, meds, or foods. No exacerbation with with direct pressure or cold.  No PMH asthma; no angioedema.  Benadryl helped.  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       Single       Never Smoked       Alcohol use-yes       Drug use-no       Regular exercise-yes    Review of Systems  General      Denies chills, fever, sweats, and weight loss.  Eyes      Denies discharge, eye pain, and red eye.  Resp      Denies cough, shortness of breath, sputum productive, and wheezing.  GU      Denies dysuria and urinary frequency.  Allergy      Complains of hives or rash.      Denies itching eyes, seasonal allergies, and sneezing.   Physical Exam  General:     Well-developed,well-nourished,in no acute distress;  alert,appropriate and cooperative throughout examination Mouth:     Oral mucosa and oropharynx without lesions or exudates.  Teeth in good repair. Lungs:     Normal respiratory effort, chest expands symmetrically. Lungs are clear to auscultation, no crackles or wheezes. Abdomen:     Bowel sounds positive,abdomen soft and non-tender without masses, organomegaly or hernias noted. Skin:     2 small urticarial lesions R elbow; dermatographia Cervical Nodes:     No lymphadenopathy noted Axillary Nodes:     No palpable lymphadenopathy    Impression & Recommendations:  Problem # 1:  URTICARIA (ICD-708.9)  Complete Medication List: 1)  Ortho Tri-cyclen (28) 0.035 Mg Tabs (Norgestimate-ethinyl estradiol) .Marland Kitchen.. 1 once daily 2)  Fexofenadine Hcl 180 Mg Tabs (Fexofenadine hcl) .Marland Kitchen.. 1 once daily prn   Patient Instructions: 1)  Bland diet ; monitor for triggers over prior 8-12  hrs.    Prescriptions: FEXOFENADINE HCL 180 MG  TABS (FEXOFENADINE HCL) 1 once daily prn  #30 x 5   Entered and Authorized by:   Marga Melnick MD   Signed by:   Marga Melnick MD on 07/18/2007   Method used:   Print then Give to Patient   RxID:   [id number]  ]

## 2010-08-02 NOTE — Assessment & Plan Note (Signed)
Summary: POSSIBLE YEAST INFECTION.CBS   Vital Signs:  Patient profile:   25 year old female Height:      62.25 inches Weight:      134 pounds Temp:     98.5 degrees F oral Pulse rate:   72 / minute Resp:     18 per minute BP sitting:   100 / 68  (right arm)  Vitals Entered By: Ardyth Man (January 27, 2009 8:17 AM) CC: possible uti / yeast infection x 1 month, Dysuria Is Patient Diabetic? No  Have you ever been in a relationship where you felt threatened, hurt or afraid?No   Does patient need assistance? Functional Status Self care Ambulation Normal   History of Present Illness:  Dysuria      This is a 25 year old woman who presents with Dysuria.  The patient complains of burning with urination, urinary frequency, vaginal discharge, and vaginal itching, but denies urgency, hematuria, and vaginal sores.  The patient denies the following associated symptoms: nausea, vomiting, fever, shaking chills, flank pain, abdominal pain, back pain, pelvic pain, and arthralgias.  The patient denies the following risk factors: diabetes, prior antibiotics, immunosuppression, history of GU anomaly, history of pyelonephritis, pregnancy, history of STD, and analgesic abuse.  History is significant for no urinary tract problems.    Current Medications (verified): 1)  Ortho Tri-Cyclen (28) 0.035 Mg Tabs (Norgestimate-Ethinyl Estradiol) .Marland Kitchen.. 1 Once Daily 2)  Cipro 500 Mg Tabs (Ciprofloxacin Hcl) .Marland Kitchen.. 1 By Mouth Two Times A Day 3)  Metrogel-Vaginal 0.75 % Gel (Metronidazole) .Marland Kitchen.. 1 Applicator Pv At Bedtime  Allergies (verified): No Known Drug Allergies  Past History:  Past medical, surgical, family and social histories (including risk factors) reviewed, and no changes noted (except as noted below).  Past Medical History: Reviewed history from 07/18/2007 and no changes required. ADD UTI urinalysis , abnormal  Past Surgical History: Reviewed history from 02/08/2007 and no changes  required. Denies surgical history  Family History: Reviewed history from 02/08/2007 and no changes required. F-Family History Hypertension Lymphoma PGF- htn, DM  Social History: Reviewed history from 02/08/2007 and no changes required. 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 Genitalia:  normal introitus, no external lesions, and vaginal discharge.   Skin:  Intact without suspicious lesions or rashes Cervical Nodes:  No lymphadenopathy noted Psych:  Oriented X3 and normally interactive.     Impression & Recommendations:  Problem # 1:  UTI (ICD-599.0)  Her updated medication list for this problem includes:    Cipro 500 Mg Tabs (Ciprofloxacin hcl) .Marland Kitchen... 1 by mouth two times a day  Orders: T-Culture, Urine ([address]-86578) Urine Pregnancy Test  (46962) UA Dipstick w/o Micro (manual) (95284) Wet Prep (13244WN)  Encouraged to push clear liquids, get enough rest, and take acetaminophen as needed. To be seen in 10 days if no improvement, sooner if worse.  Problem # 2:  BACTERIAL VAGINITIS (ICD-616.10)  Her updated medication list for this problem includes:    Cipro 500 Mg Tabs (Ciprofloxacin hcl) .Marland Kitchen... 1 by mouth two times a day    Metrogel-vaginal 0.75 % Gel (Metronidazole) .Marland Kitchen... 1 applicator pv at bedtime  Discussed symptomatic relief and treatment options.   Orders: Urine Pregnancy Test  [id number]) UA  Dipstick w/o Micro (manual) (64403) Wet Prep (47425ZD)  Complete Medication List: 1)  Ortho Tri-cyclen (28) 0.035 Mg Tabs (Norgestimate-ethinyl estradiol) .Marland Kitchen.. 1 once daily 2)  Cipro 500 Mg Tabs (Ciprofloxacin hcl) .Marland Kitchen.. 1 by mouth two  times a day 3)  Metrogel-vaginal 0.75 % Gel (Metronidazole) .Marland Kitchen.. 1 applicator pv at bedtime Prescriptions: METROGEL-VAGINAL 0.75 % GEL (METRONIDAZOLE) 1 applicator PV at bedtime  #5 x 0   Entered and Authorized by:   Loreen Freud DO   Signed by:   Loreen Freud DO on 01/27/2009   Method used:   Electronically to        [facility]* (retail)       [address] [place], [place]  [address]       Ph: [phone]       Fax: [phone]   RxID:   [id number] CIPRO 500 MG TABS (CIPROFLOXACIN HCL) 1 by mouth two times a day  #10 x 0   Entered and Authorized by:   Loreen Freud DO   Signed by:   Loreen Freud DO on 01/27/2009   Method used:   Electronically to        [facility]* (retail)       [address] [place], [place]  [address]       Ph: [phone]       Fax: [phone]   RxID:   [id number]   Laboratory Results   Urine Tests    Routine Urinalysis   Color: yellow Appearance: Cloudy Glucose: negative   (Normal Range: Negative) Bilirubin: negative   (Normal Range: Negative) Ketone: negative   (Normal Range: Negative) Spec. Gravity: 1.010   (Normal Range: 1.003-1.035) Blood: large   (Normal Range: Negative) pH: 6.0   (Normal Range: 5.0-8.0) Protein: 30   (Normal Range: Negative) Urobilinogen: negative   (Normal Range: 0-1) Nitrite: positive   (Normal Range: Negative) Leukocyte Esterace: moderate   (Normal Range: Negative)    Urine HCG: negative Date/Time Received: January 27, 2009 8:53 AM  Date/Time Reported: January 27, 2009 8:55 AM   Wet Mount/KOH Source: vaginal Bacteria/hpf 2+ Clue cells/hpf many  Positive whiff Trichomonas/hpf none

## 2010-08-02 NOTE — Consult Note (Signed)
Summary: [facility]  [facility]   Imported By: Lanelle Bal 09/06/2009 12:29:04  _____________________________________________________________________  External Attachment:    Type:   Image     Comment:   External Document

## 2010-08-02 NOTE — Letter (Signed)
Summary: Medical Report Form  Medical Report Form   Imported By: Lanelle Bal 01/19/2010 10:36:18  _____________________________________________________________________  External Attachment:    Type:   Image     Comment:   External Document

## 2010-08-02 NOTE — Assessment & Plan Note (Signed)
Summary: tb test//needed am appt//lch  Nurse Visit   Allergies: No Known Drug Allergies  Immunizations Administered:  PPD Skin Test:    Vaccine Type: PPD    Site: left forearm    Mfr: Sanofi Pasteur    Dose: 0.1 ml    Route: ID    Given by: Floydene Flock    Exp. Date: 08/30/2009    Lot #: [id number]  Orders Added: 1)  TB Skin Test [86580] 2)  Admin 1st Vaccine [90471]  Appended Document: tb test//needed am appt//lch   PPD Results    Date of reading: 08/27/2009    Results: < 5mm    Interpretation: negative

## 2010-08-02 NOTE — Progress Notes (Signed)
Summary: PHONE NOTE TO DISCUSS LABS AND PAP  Phone Note Outgoing Call   Call placed by: Shonna Chock,  February 18, 2007 2:06 PM Details for Reason: PAP, STD/TESTING Summary of Call: LMOM: REASON FOR CALL WAS TO TELL PATIENT PAP NORMAL, G/CHLYMIDA-NEG AND OTHER STD TESTING WAS NEG. WATING FOR PATIENT TO RETURN CALL.  Follow-up for Phone Call        D/W PATIENT, PATIENT AWARE I WILL MAIL HER A COPY OF LABS Follow-up by: Shonna Chock,  February 19, 2007 11:26 AM

## 2010-08-02 NOTE — Assessment & Plan Note (Signed)
Summary: bladder infection/alr   Vital Signs:  Patient Profile:   25 Years Old Female Height:     62.25 inches Pulse rate:   80 / minute BP sitting:   116 / 72  (left arm) Cuff size:   regular  Vitals Entered By: Shonna Chock (April 19, 2007 9:30 AM)                 PCP:  Laury Axon  Chief Complaint:  POSSIBLE BLADDER INFECTION and Dysuria.  History of Present Illness:  Dysuria      This is a 25 year old woman who presents with Dysuria.  The symptoms began duration > 3 days ago.  The intensity is described as mild.  Pt here c/o burning with urination for 1 week.  The patient reports burning with urination and urinary frequency, but denies urgency, hematuria, vaginal discharge, vaginal itching, vaginal sores, and penile discharge.  The patient denies the following associated symptoms: nausea, vomiting, fever, shaking chills, flank pain, abdominal pain, back pain, pelvic pain, and arthralgias.  The patient denies the following risk factors: diabetes, prior antibiotics, immunosuppression, history of GU anomaly, history of pyelonephritis, pregnancy, history of STD, and analgesic abuse.    Current Allergies: No known allergies      Review of Systems      See HPI   Physical Exam  General:     Well-developed,well-nourished,in no acute distress; alert,appropriate and cooperative throughout examination Abdomen:     Bowel sounds positive,abdomen soft and non-tender without masses, organomegaly or hernias noted. Msk:     no flank pain    Impression & Recommendations:  Problem # 1:  UTI (ICD-599.0)  Orders: T-Culture, Urine ([address]) UA Dipstick w/o Micro (09811)  Encouraged to push clear liquids, get enough rest, and take acetaminophen as needed. To be seen in 10 days if no improvement, sooner if worse.  Her updated medication list for this problem includes:    Macrobid 100 Mg Caps (Nitrofurantoin monohyd macro) .Marland Kitchen... 1 by mouth two times a day   Complete  Medication List: 1)  Ortho Tri-cyclen (28) 0.035 Mg Tabs (Norgestimate-ethinyl estradiol) .Marland Kitchen.. 1 once daily 2)  Macrobid 100 Mg Caps (Nitrofurantoin monohyd macro) .Marland Kitchen.. 1 by mouth two times a day     Prescriptions: MACROBID 100 MG  CAPS (NITROFURANTOIN MONOHYD MACRO) 1 by mouth two times a day  #14 x 0   Entered and Authorized by:   Loreen Freud DO   Signed by:   Loreen Freud DO on 04/19/2007   Method used:   Electronically sent to ...       [facility] [address]       [address]       [place], [place]  [address]       Ph: [phone]       Fax: [phone]   RxID:   [id number]  ] Laboratory Results   Urine Tests   Date/Time Reported: April 19, 2007 9:44 AM   Routine Urinalysis   Color: yellow Appearance: Clear Glucose: negative   (Normal Range: Negative) Bilirubin: negative   (Normal Range: Negative) Ketone: negative   (Normal Range: Negative) Spec. Gravity: 1.015   (Normal Range: 1.003-1.035) Blood: large   (Normal Range: Negative) pH: 6.5   (Normal Range: 5.0-8.0) Protein: negative   (Normal Range: Negative) Urobilinogen: negative   (Normal Range: 0-1) Nitrite: negative   (Normal Range: Negative) Leukocyte Esterace: moderate   (Normal Range:  Negative)    Urine HCG: negative

## 2010-08-02 NOTE — Assessment & Plan Note (Signed)
Summary: tb test.cbs  Nurse Visit    Prior Medications: ORTHO TRI-CYCLEN (28) 0.035 MG TABS (NORGESTIMATE-ETHINYL ESTRADIOL) 1 once daily FEXOFENADINE HCL 180 MG  TABS (FEXOFENADINE HCL) 1 once daily prn Current Allergies: No known allergies    PPD Application    Vaccine Type: PPD    Site: left forearm    Mfr: Sanofi Pasteur    Dose: 0.1 ml    Route: ID    Given by: Floydene Flock CMA    Exp. Date: 04/24/2008    Lot #: [id number]   Orders Added: 1)  TB Skin Test Maurine.Goes    ]  Appended Document: tb test.cbs     PPD Results    Date of reading: 04/06/2008    Results: < 5mm    Interpretation: negative

## 2010-08-02 NOTE — Letter (Signed)
Summary: Out of Work  [facility]  [address] [place], [place] [address]   Phone: [phone]  Fax: [phone]    September 15, 2008   Employee:  TELINA KLECKLEY    To Whom It May Concern:   For Medical reasons, please excuse the above named employee from work for the following dates:  Start:   September 14, 2008  End:   September 15, 2008  If you need additional information, please feel free to contact our office.         Sincerely,    Loreen Freud DO

## 2010-09-23 ENCOUNTER — Other Ambulatory Visit: Payer: Self-pay | Admitting: Family Medicine

## 2010-10-28 ENCOUNTER — Other Ambulatory Visit: Payer: Self-pay | Admitting: Family Medicine

## 2010-11-12 ENCOUNTER — Encounter: Payer: Self-pay | Admitting: Family Medicine

## 2010-11-14 ENCOUNTER — Ambulatory Visit (INDEPENDENT_AMBULATORY_CARE_PROVIDER_SITE_OTHER): Payer: BC Managed Care – PPO | Admitting: Family Medicine

## 2010-11-14 ENCOUNTER — Telehealth: Payer: Self-pay | Admitting: *Deleted

## 2010-11-14 ENCOUNTER — Ambulatory Visit: Payer: Self-pay | Admitting: Family Medicine

## 2010-11-14 ENCOUNTER — Encounter: Payer: Self-pay | Admitting: Family Medicine

## 2010-11-14 VITALS — BP 100/60 | HR 62 | Temp 98.8°F | Wt 133.2 lb

## 2010-11-14 DIAGNOSIS — F988 Other specified behavioral and emotional disorders with onset usually occurring in childhood and adolescence: Secondary | ICD-10-CM

## 2010-11-14 DIAGNOSIS — Z0289 Encounter for other administrative examinations: Secondary | ICD-10-CM

## 2010-11-14 MED ORDER — AMPHETAMINE-DEXTROAMPHETAMINE 20 MG PO TABS: 20.0000 mg | ORAL_TABLET | Freq: Two times a day (BID) | ORAL | Status: DC

## 2010-11-14 MED ORDER — AMPHETAMINE-DEXTROAMPHET ER 20 MG PO CP24: 20.0000 mg | ORAL_CAPSULE | ORAL | Status: DC

## 2010-11-14 NOTE — Assessment & Plan Note (Signed)
adderall xr 20 mg qd Pt rto 1 month for cpe and f/u

## 2010-11-14 NOTE — Telephone Encounter (Addendum)
Call from pharmacy indicating that the adderall 20 XR is too expensive under Pt plan. Per pharmacy Pt would like to know if she can change to regular release.Please advise

## 2010-11-14 NOTE — Patient Instructions (Signed)
Attention Deficit-Hyperactivity Disorder ADHD Attention deficit-hyperactivity disorder (ADHD) is a problem with behavior issues based on the way the brain functions (neurobehavioral disorder). It is a common reason for behavior and academic problems in school. CAUSES The cause of ADHD is unknown in most cases. It may run in families. It sometimes can be associated with learning disabilities and other behavioral problems. SYMPTOMS There are three types of ADHD. Some of the symptoms include:  Inattentive   Gets bored or distracted easily   Loses or forgets things. Forgets to hand in homework.   Has trouble organizing or completing tasks.   Difficulty staying on task.   An inability to organize daily tasks and school work.   Leaving projects, chores and homework unfinished.   Trouble paying attention or responding to details. Careless mistakes.   Difficulty following directions. Often seems like is not listening.   Dislikes activities that require sustained attention (like chores or homework).   Hyperactive-impulsive   Feels like it is impossible to sit still or stay in a seat. Fidgeting with hands and feet.   Trouble waiting turn.   Talking too much or out of turn. Interruptive.   Speaks or acts impulsively   Aggressive, disruptive behavior   Constantly busy or on the go, noisy.   Combined   Has symptoms of both of the above.  Often children with ADHD feel discouraged about themselves and with school. They often perform well below their abilities in school. These symptoms can cause problems in home, school, and in relationships with peers. As children get older, the excess motor activities can calm down, but the problems with paying attention and staying organized persist. Most children do not outgrow ADHD but with good treatment can learn to cope with the symptoms. DIAGNOSIS When ADHD is suspected, the diagnosis should be made by professionals trained in ADHD.    Diagnosis will include:  Ruling out other reasons for the child's behavior.   The caregivers will check with the child's school and check their medical records.   They will talk to teachers and parents.   Behavior rating scales for the child will be filled out by those dealing with the child on a daily basis.  A diagnosis is made only after all information has been considered. TREATMENT Treatment usually includes behavioral treatment often along with medicines. It may include stimulant medicines. The stimulant medicines decrease impulsivity and hyperactivity and increase attention. Other medicines used include antidepressants and certain blood pressure medicines. Most experts agree that treatment for ADHD should address all aspects of the child's functioning. Treatment should not be limited to the use of medicines alone. Treatment should include structured classroom management. The parents must receive education to address rewarding good behavior, discipline and limit-setting. Tutoring and/or behavioral therapy should be available for the child. If untreated, the disorder can have long term serious effects into adolescence and adulthood. HOMECARE INSTRUCTIONS   Often with ADHD there is a lot of frustration among the family in dealing with the illness. There is often blame and anger that is not warranted. This is a life long illness. There is no way to prevent ADHD. In many cases, because the problem affects the family as a whole, the entire family may need help. A therapist can help the family find better ways to handle the disruptive behaviors and promote change. If the child is young, most of the therapist's work is with the parents. Parents will learn techniques for coping with and improving their child's behavior.   Sometimes only the child with the ADHD needs counseling. Your caregivers can help you make these decisions.   Children with ADHD may need help in organizing. Here are some helpful  tips:   Keep routines the same every day from wake-up time to bedtime. Schedule everything. This includes homework and playtime. This should include outdoor and indoor recreation. Keep the schedule on the refrigerator or a bulletin board where it is frequently seen. Mark schedule changes as far in advance as possible.   Have a place for everything and keep everything in its place. This includes clothing, backpacks, and school supplies.   Encourage writing down assignments and bringing home needed books.   Offer your child a well-balanced diet. Breakfast is especially important for school performance. Children should avoid drinks with caffeine including:   Soft drinks.   Coffee.   Tea.   However, some older children (adolescents) may find these drinks helpful in improving their attention.   Children with ADHD need consistent rules that they can understand and follow. If rules are followed, give small rewards. Children with ADHD often receive, and expect, criticism. Look for good behavior and praise it. Set realistic goals. Give clear instructions. Look for activities that can foster success and self-esteem. Make time for pleasant activities with your child. Give lots of affection.   Parents are their children's greatest advocates. Learn as much as possible about ADHD. This helps you become a stronger and better advocate for your child. It also helps you educate your child's teachers and instructors if they feel inadequate in these areas. Parent support groups are often helpful. A national group with local chapters is called CHADD (Children and Adults with Attention Deficit/Hyperactivity Disorder).  PROGNOSIS  There is no cure for ADHD. Children with the disorder seldom outgrow it. Many find adaptive ways to accommodate the ADHD as they mature. SEEK MEDICAL CARE IF YOUR CHILD HAS:  Repeated muscle twitches, cough or speech outbursts.   Sleep problems.   Marked loss of appetite.    Depression.   New or worsening behavioral problems.   Dizziness.   Racing heart.   Stomach pains.   Headaches.  Document Released: 06/09/2002 Document Re-Released: 03/28/2008 Exit[employer] Patient Information 2011 [employer]. 

## 2010-11-14 NOTE — Telephone Encounter (Signed)
Pt mom aware Rx ready for pick-up, will bring in old Rx

## 2010-11-14 NOTE — Telephone Encounter (Signed)
Yes---adderall 20 mg bid #60

## 2010-11-14 NOTE — Progress Notes (Signed)
  Subjective:    Patient ID: Margaret Horton, female    DOB: 05-May-1986, 25 y.o.   MRN: [id number]  HPI Pt here for evaluation of ADD.  Pt was diagnosed in elementary school and was recently retested and was found to have ADD.  Pt was on adderall in the past.     Review of Systems  Constitutional: Negative.   Psychiatric/Behavioral: Positive for decreased concentration. Negative for hallucinations, behavioral problems, confusion, dysphoric mood and agitation.       Objective:   Physical Exam  Constitutional: She appears well-developed and well-nourished.  Cardiovascular: Normal rate, regular rhythm and normal heart sounds.   No murmur heard. Pulmonary/Chest: Effort normal and breath sounds normal.  Psychiatric: She has a normal mood and affect. Her behavior is normal. Judgment normal.          Assessment & Plan:

## 2010-11-19 ENCOUNTER — Emergency Department (HOSPITAL_BASED_OUTPATIENT_CLINIC_OR_DEPARTMENT_OTHER)
Admission: EM | Admit: 2010-11-19 | Discharge: 2010-11-20 | Disposition: A | Discharge status: Home / Self Care | Payer: BC Managed Care – PPO | Attending: Emergency Medicine | Admitting: Emergency Medicine

## 2010-11-19 DIAGNOSIS — T43605A Adverse effect of unspecified psychostimulants, initial encounter: Secondary | ICD-10-CM | POA: Insufficient documentation

## 2010-11-19 DIAGNOSIS — R002 Palpitations: Secondary | ICD-10-CM | POA: Insufficient documentation

## 2010-11-21 ENCOUNTER — Telehealth: Payer: Self-pay | Admitting: *Deleted

## 2010-11-21 NOTE — Telephone Encounter (Signed)
They all have similar side effects---straterra would have that problem but it can take 2 months to work.  We can try that one or we can give names of specialists.

## 2010-11-21 NOTE — Telephone Encounter (Signed)
Pt was in ER with heart racing---- make sure she stopped adderall. thankyou. ----- Message ----- From: SYSTEM Sent: 11/20/2010 3:37 AM To: Lelon Perla, DO

## 2010-11-21 NOTE — Telephone Encounter (Signed)
Mssg left to callback     KP ---- Mother wanted to know if there was something else the patient could take other than Adderall-- Pease advise     KP

## 2010-11-21 NOTE — Telephone Encounter (Signed)
Pt mom left VM that Pt has a reaction to Adderall and was seen in ED for symptoms. Pt was c/o chest pain, SOB, and irregular heart beat. Pt mom would like to know if another med can be Rx Please advise

## 2010-11-22 ENCOUNTER — Telehealth: Payer: Self-pay | Admitting: Family Medicine

## 2010-11-22 MED ORDER — ATOMOXETINE HCL 40 MG PO CAPS: ORAL_CAPSULE | ORAL | Status: DC

## 2010-11-22 NOTE — Telephone Encounter (Signed)
That has very similar side effect profile as adderall---she may have same problem.

## 2010-11-22 NOTE — Telephone Encounter (Signed)
Spoke with patient and she stated she would like to try something different  Before going to a specialist. Please advise of Medication and dosing      KP

## 2010-11-22 NOTE — Telephone Encounter (Signed)
Pt's mother brought RX for Strattera 40 mg Capsule back adn says that with her insurance it is still going to be over $200. Is there anything else that could be given? Pharmacy recommended maybe a low dose of Ritalin. Please advise.

## 2010-11-22 NOTE — Telephone Encounter (Signed)
Best # to c/b [phone]

## 2010-11-22 NOTE — Telephone Encounter (Signed)
Left msg for pt to return call.

## 2010-11-22 NOTE — Telephone Encounter (Signed)
Pt mom aware Rx ready for pick up. 

## 2010-11-22 NOTE — Telephone Encounter (Signed)
stratterra 40 mg  #60  1 po qd for 3 days then inc to 2 po qd ov in one month to see if dose needs to be increased from there.

## 2010-11-22 NOTE — Telephone Encounter (Signed)
Addended byCandie Echevaria on: 11/22/2010 10:26 AM   Modules accepted: Orders

## 2010-11-30 NOTE — Telephone Encounter (Signed)
Left message to call office

## 2010-12-01 ENCOUNTER — Encounter: Payer: Self-pay | Admitting: *Deleted

## 2010-12-01 NOTE — Telephone Encounter (Signed)
Left message to call office. Letter Mail to Pt after several attempts to contact Pt.

## 2010-12-06 ENCOUNTER — Encounter: Payer: Self-pay | Admitting: Family Medicine

## 2010-12-08 ENCOUNTER — Other Ambulatory Visit: Payer: Self-pay | Admitting: Family Medicine

## 2010-12-10 ENCOUNTER — Encounter: Payer: Self-pay | Admitting: Family Medicine

## 2010-12-15 ENCOUNTER — Ambulatory Visit (INDEPENDENT_AMBULATORY_CARE_PROVIDER_SITE_OTHER): Payer: BC Managed Care – PPO | Admitting: Family Medicine

## 2010-12-15 ENCOUNTER — Other Ambulatory Visit (HOSPITAL_COMMUNITY)
Admission: RE | Admit: 2010-12-15 | Discharge: 2010-12-15 | Disposition: A | Discharge status: Home / Self Care | Payer: BC Managed Care – PPO | Source: Ambulatory Visit | Attending: Family Medicine | Admitting: Family Medicine

## 2010-12-15 ENCOUNTER — Encounter: Payer: Self-pay | Admitting: Family Medicine

## 2010-12-15 VITALS — BP 110/78 | HR 81 | Temp 98.0°F | Ht 62.5 in | Wt 132.0 lb

## 2010-12-15 DIAGNOSIS — Z0000 Encounter for general adult medical examination without abnormal findings: Secondary | ICD-10-CM

## 2010-12-15 DIAGNOSIS — Z01419 Encounter for gynecological examination (general) (routine) without abnormal findings: Secondary | ICD-10-CM | POA: Insufficient documentation

## 2010-12-15 LAB — HEPATIC FUNCTION PANEL
ALT: 16 U/L (ref 0–35)
AST: 18 U/L (ref 0–37)
Albumin: 4 g/dL (ref 3.5–5.2)
Alkaline Phosphatase: 44 U/L (ref 39–117)
Bilirubin, Direct: 0.2 mg/dL (ref 0.0–0.3)
Total Bilirubin: 1.1 mg/dL (ref 0.3–1.2)
Total Protein: 7 g/dL (ref 6.0–8.3)

## 2010-12-15 LAB — CBC WITH DIFFERENTIAL/PLATELET
Basophils Absolute: 0 10*3/uL (ref 0.0–0.1)
Basophils Relative: 0.4 % (ref 0.0–3.0)
Eosinophils Absolute: 0.1 10*3/uL (ref 0.0–0.7)
Eosinophils Relative: 0.7 % (ref 0.0–5.0)
HCT: 37.7 % (ref 36.0–46.0)
Hemoglobin: 12.9 g/dL (ref 12.0–15.0)
Lymphocytes Relative: 22.5 % (ref 12.0–46.0)
Lymphs Abs: 2 10*3/uL (ref 0.7–4.0)
MCHC: 34.4 g/dL (ref 30.0–36.0)
MCV: 91.4 fl (ref 78.0–100.0)
Monocytes Absolute: 0.4 10*3/uL (ref 0.1–1.0)
Monocytes Relative: 4.2 % (ref 3.0–12.0)
Neutro Abs: 6.5 10*3/uL (ref 1.4–7.7)
Neutrophils Relative %: 72.2 % (ref 43.0–77.0)
Platelets: 196 10*3/uL (ref 150.0–400.0)
RBC: 4.12 Mil/uL (ref 3.87–5.11)
RDW: 12.7 % (ref 11.5–14.6)
WBC: 9.1 10*3/uL (ref 4.5–10.5)

## 2010-12-15 LAB — LIPID PANEL
Cholesterol: 195 mg/dL (ref 0–200)
HDL: 58.8 mg/dL (ref >39.00)
LDL Cholesterol: 123 mg/dL — ABNORMAL HIGH (ref 0–99)
Total CHOL/HDL Ratio: 3
Triglycerides: 66 mg/dL (ref 0.0–149.0)
VLDL: 13.2 mg/dL (ref 0.0–40.0)

## 2010-12-15 LAB — BASIC METABOLIC PANEL
BUN: 12 mg/dL (ref 6–23)
CO2: 28 mEq/L (ref 19–32)
Calcium: 8.9 mg/dL (ref 8.4–10.5)
Chloride: 105 mEq/L (ref 96–112)
Creatinine, Ser: 0.7 mg/dL (ref 0.4–1.2)
GFR: 102.52 mL/min (ref >60.00)
Glucose, Bld: 81 mg/dL (ref 70–99)
Potassium: 4.2 mEq/L (ref 3.5–5.1)
Sodium: 140 mEq/L (ref 135–145)

## 2010-12-15 LAB — TSH: TSH: 1.16 u[IU]/mL (ref 0.35–5.50)

## 2010-12-15 NOTE — Patient Instructions (Signed)

## 2010-12-15 NOTE — Progress Notes (Signed)
?  Subjective:     Margaret Horton is a 25 y.o. female and is here for a comprehensive physical exam. The patient reports no problems.  History   Social History  . Marital Status: Single    Spouse Name: N/A    Number of Children: N/A  . Years of Education: N/A   Occupational History  . aesthetics--in school    Social History Main Topics  . Smoking status: Never Smoker   . Smokeless tobacco: Never Used  . Alcohol Use: 0.6 oz/week    1 Glasses of wine per week     very rare  . Drug Use: No  . Sexually Active: Yes -- Female partner(s)   Other Topics Concern  . Not on file   Social History Narrative  . No narrative on file   Health Maintenance  Topic Date Due  . Pap Smear  12/30/2004  . Tetanus/tdap  07/28/2019    The following portions of the patient's history were reviewed and updated as appropriate: allergies, current medications, past family history, past medical history, past social history, past surgical history and problem list.  Review of Systems Review of Systems  Constitutional: Negative for activity change, appetite change and fatigue.  HENT: Negative for hearing loss, congestion, tinnitus and ear discharge.  dentist q47m Eyes: Negative for visual disturbance (see optho q1y -- vision corrected to 20/20 with glasses).  Respiratory: Negative for cough, chest tightness and shortness of breath.   Cardiovascular: Negative for chest pain, palpitations and leg swelling.  Gastrointestinal: Negative for abdominal pain, diarrhea, constipation and abdominal distention.  Genitourinary: Negative for urgency, frequency, decreased urine volume and difficulty urinating.  Musculoskeletal: Negative for back pain, arthralgias and gait problem.  Skin: Negative for color change, pallor and rash.  Neurological: Negative for dizziness, light-headedness, numbness and headaches.  Hematological: Negative for adenopathy. Does not bruise/bleed easily.  Psychiatric/Behavioral: Negative for  suicidal ideas, confusion, sleep disturbance, self-injury, dysphoric mood, decreased concentration and agitation.       Objective:    BP 110/78  Pulse 81  Temp(Src) 98 F (36.7 C) (Oral)  Ht 5' 2.5" (1.588 m)  Wt 132 lb (59.875 kg)  BMI 23.76 kg/m2  SpO2 98%  LMP 12/13/2010 General appearance: alert, cooperative, appears stated age and no distress Head: Normocephalic, without obvious abnormality, atraumatic Eyes: conjunctivae/corneas clear. PERRL, EOM's intact. Fundi benign. Ears: normal TM's and external ear canals both ears Nose: Nares normal. Septum midline. Mucosa normal. No drainage or sinus tenderness. Throat: lips, mucosa, and tongue normal; teeth and gums normal Neck: no adenopathy, no carotid bruit, no JVD, supple, symmetrical, trachea midline and thyroid not enlarged, symmetric, no tenderness/mass/nodules Lungs: clear to auscultation bilaterally Breasts: normal appearance, no masses or tenderness Heart: regular rate and rhythm, S1, S2 normal, no murmur, click, rub or gallop Abdomen: soft, non-tender; bowel sounds normal; no masses,  no organomegaly Pelvic: cervix normal in appearance, external genitalia normal, no adnexal masses or tenderness, no cervical motion tenderness, rectovaginal septum normal, uterus normal size, shape, and consistency and vagina normal without discharge Extremities: extremities normal, atraumatic, no cyanosis or edema Pulses: 2+ and symmetric Skin: Skin color, texture, turgor normal. No rashes or lesions Lymph nodes: Cervical, supraclavicular, and axillary nodes normal. Neurologic: Grossly normal psych--  no depression/ anxiety    Assessment:    Healthy female exam.    ADD   Plan:    ghm utd Check fasting labs con't meds See After Visit Summary for Counseling Recommendations

## 2010-12-30 ENCOUNTER — Telehealth: Payer: Self-pay

## 2010-12-30 NOTE — Telephone Encounter (Signed)
Fill 10 mg

## 2010-12-30 NOTE — Telephone Encounter (Signed)
Call from patient and she stated she had a reaction to 20 mg Adderrall and had been cutting it in half. She wanted to get a refill on the meds and not sure what you want to fill. Patient will pick it up on Monday. Please advise    KP

## 2011-01-02 MED ORDER — AMPHETAMINE-DEXTROAMPHETAMINE 10 MG PO TABS: 10.0000 mg | ORAL_TABLET | Freq: Every day | ORAL | Status: DC

## 2011-01-02 NOTE — Telephone Encounter (Signed)
Patient aware Rx ready for pick up.      KP 

## 2011-03-09 ENCOUNTER — Other Ambulatory Visit: Payer: Self-pay | Admitting: Family Medicine

## 2011-04-14 ENCOUNTER — Other Ambulatory Visit: Payer: Self-pay | Admitting: Family Medicine

## 2011-04-14 NOTE — Telephone Encounter (Signed)
rx adderall - patient will pick up Monday afternoon [id number]

## 2011-04-17 MED ORDER — AMPHETAMINE-DEXTROAMPHETAMINE 10 MG PO TABS: 10.0000 mg | ORAL_TABLET | Freq: Every day | ORAL | Status: DC

## 2011-04-17 NOTE — Telephone Encounter (Signed)
Rx done and placed up front for pickup. 

## 2011-06-01 ENCOUNTER — Emergency Department (HOSPITAL_COMMUNITY)
Admission: EM | Admit: 2011-06-01 | Discharge: 2011-06-02 | Disposition: A | Discharge status: Home / Self Care | Payer: BC Managed Care – PPO | Attending: Emergency Medicine | Admitting: Emergency Medicine

## 2011-06-01 ENCOUNTER — Encounter (HOSPITAL_COMMUNITY): Payer: Self-pay | Admitting: Emergency Medicine

## 2011-06-01 DIAGNOSIS — F29 Unspecified psychosis not due to a substance or known physiological condition: Secondary | ICD-10-CM | POA: Insufficient documentation

## 2011-06-01 DIAGNOSIS — F411 Generalized anxiety disorder: Secondary | ICD-10-CM | POA: Insufficient documentation

## 2011-06-01 DIAGNOSIS — R112 Nausea with vomiting, unspecified: Secondary | ICD-10-CM | POA: Insufficient documentation

## 2011-06-01 DIAGNOSIS — F988 Other specified behavioral and emotional disorders with onset usually occurring in childhood and adolescence: Secondary | ICD-10-CM | POA: Insufficient documentation

## 2011-06-01 DIAGNOSIS — F1010 Alcohol abuse, uncomplicated: Secondary | ICD-10-CM | POA: Insufficient documentation

## 2011-06-01 DIAGNOSIS — F410 Panic disorder [episodic paroxysmal anxiety] without agoraphobia: Secondary | ICD-10-CM

## 2011-06-01 DIAGNOSIS — R002 Palpitations: Secondary | ICD-10-CM | POA: Insufficient documentation

## 2011-06-01 HISTORY — DX: Anxiety disorder, unspecified: F41.9

## 2011-06-01 NOTE — ED Notes (Signed)
PT. REPORTS PERSISTENT VOMITTING AFTER DRINKING VODKA THIS EVENING .

## 2011-06-02 ENCOUNTER — Encounter (HOSPITAL_COMMUNITY): Payer: Self-pay | Admitting: *Deleted

## 2011-06-02 LAB — BASIC METABOLIC PANEL
BUN: 7 mg/dL (ref 6–23)
CO2: 27 mEq/L (ref 19–32)
Calcium: 8.8 mg/dL (ref 8.4–10.5)
Chloride: 104 mEq/L (ref 96–112)
Creatinine, Ser: 0.57 mg/dL (ref 0.50–1.10)
GFR calc Af Amer: >90 mL/min (ref >90)
GFR calc non Af Amer: >90 mL/min (ref >90)
Glucose, Bld: 116 mg/dL — ABNORMAL HIGH (ref 70–99)
Potassium: 4.1 mEq/L (ref 3.5–5.1)
Sodium: 142 mEq/L (ref 135–145)

## 2011-06-02 LAB — CBC
HCT: 41.9 % (ref 36.0–46.0)
Hemoglobin: 13.9 g/dL (ref 12.0–15.0)
MCH: 29.9 pg (ref 26.0–34.0)
MCHC: 33.2 g/dL (ref 30.0–36.0)
MCV: 90.1 fL (ref 78.0–100.0)
Platelets: 262 10*3/uL (ref 150–400)
RBC: 4.65 MIL/uL (ref 3.87–5.11)
RDW: 12.2 % (ref 11.5–15.5)
WBC: 8.9 10*3/uL (ref 4.0–10.5)

## 2011-06-02 LAB — ETHANOL: Alcohol, Ethyl (B): 139 mg/dL — ABNORMAL HIGH (ref 0–11)

## 2011-06-02 NOTE — ED Provider Notes (Signed)
History     CSN: [id number] Arrival date & time: 06/01/2011 10:45 PM   First MD Initiated Contact with Patient 06/02/11 0052      Chief Complaint  Patient presents with  . Alcohol Intoxication    (Consider location/radiation/quality/duration/timing/severity/associated sxs/prior treatment) Patient is a 25 y.o. female presenting with intoxication. The history is provided by the patient.  Alcohol Intoxication This is a recurrent problem. The current episode started today. Episode frequency: She had been drinking alcohol excessively and started vomiting. She had symptoms of panic, worrying about having become ill. Symptoms have since resolved. Progression since onset: She has had a history of panic in the past and feels symptoms that occurred earlier are similar. She feels back to baseline. Associated symptoms include nausea and vomiting. Pertinent negatives include no chills or fever. The symptoms are aggravated by nothing.    Past Medical History  Diagnosis Date  . Attention deficit disorder (ADD)   . Anxiety     History reviewed. No pertinent past surgical history.  Family History  Problem Relation Age of Onset  . Hypertension Father   . Kidney cancer Father   . Cancer Father 7    lyphoma  . Lymphoma    . Hypertension Paternal Grandfather   . Diabetes Paternal Grandfather     History  Substance Use Topics  . Smoking status: Former Games developer  . Smokeless tobacco: Never Used  . Alcohol Use: 1.5 oz/week    3 drink(s) per week     very rare    OB History    Grav Para Term Preterm Abortions TAB SAB Ect Mult Living                  Review of Systems  Constitutional: Negative for fever and chills.  HENT: Negative.   Eyes: Negative.   Respiratory: Negative.   Cardiovascular: Positive for palpitations.  Gastrointestinal: Positive for nausea and vomiting.  Musculoskeletal: Negative.   Skin: Negative.   Neurological: Negative.   Psychiatric/Behavioral: Positive for  confusion. Negative for suicidal ideas and self-injury.       See HPI.    Allergies  Review of patient's allergies indicates no known allergies.  Home Medications  No current outpatient prescriptions on file.  BP 122/78  Pulse 82  Temp 98.7 F (37.1 C)  Resp 14  SpO2 99%  LMP 05/11/2011  Physical Exam  Constitutional: She appears well-developed and well-nourished.       Patient is acutely intoxicated, mildly, but coherent and cooperative with ability to give detailed history.  HENT:  Head: Normocephalic.  Neck: Normal range of motion. Neck supple.  Cardiovascular: Normal rate and regular rhythm.   Pulmonary/Chest: Effort normal and breath sounds normal.  Abdominal: Soft. Bowel sounds are normal. There is no tenderness. There is no rebound and no guarding.  Musculoskeletal: Normal range of motion.  Neurological: She is alert. No cranial nerve deficit.  Skin: Skin is warm and dry. No rash noted.  Psychiatric: She has a normal mood and affect.    ED Course  Procedures (including critical care time)   Labs Reviewed  CBC  BASIC METABOLIC PANEL  ETHANOL   No results found.   No diagnosis found.    MDM          Rodena Medin, PA 06/02/11 [phone]

## 2011-06-02 NOTE — ED Provider Notes (Signed)
Medical screening examination/treatment/procedure(s) were performed by non-physician practitioner and as supervising physician I was immediately available for consultation/collaboration.   [facility] Sahand Gosch, MD 06/02/11 0846 

## 2011-10-06 ENCOUNTER — Encounter: Payer: Self-pay | Admitting: Family Medicine

## 2011-10-06 ENCOUNTER — Ambulatory Visit (INDEPENDENT_AMBULATORY_CARE_PROVIDER_SITE_OTHER): Payer: BC Managed Care – PPO | Admitting: Family Medicine

## 2011-10-06 VITALS — BP 114/80 | HR 81 | Temp 98.4°F | Wt 148.6 lb

## 2011-10-06 DIAGNOSIS — N390 Urinary tract infection, site not specified: Secondary | ICD-10-CM

## 2011-10-06 DIAGNOSIS — R300 Dysuria: Secondary | ICD-10-CM

## 2011-10-06 DIAGNOSIS — R350 Frequency of micturition: Secondary | ICD-10-CM

## 2011-10-06 LAB — POCT URINALYSIS DIPSTICK
Bilirubin, UA: NEGATIVE
Glucose, UA: NEGATIVE
Ketones, UA: NEGATIVE
Nitrite, UA: POSITIVE
Protein, UA: NEGATIVE
Spec Grav, UA: 1.015
Urobilinogen, UA: 2
pH, UA: 6.5

## 2011-10-06 MED ORDER — CIPROFLOXACIN HCL 500 MG PO TABS: 500.0000 mg | ORAL_TABLET | Freq: Two times a day (BID) | ORAL | Status: AC

## 2011-10-06 NOTE — Patient Instructions (Signed)

## 2011-10-06 NOTE — Progress Notes (Signed)
  Subjective:    Margaret Horton is a 26 y.o. female who complains of burning with urination, dysuria and frequency. She has had symptoms for several days. Patient also complains of vaginal discharge-- it has since resolved.  Patient denies back pain, congestion, cough, fever and stomach ache. Patient does not have a history of recurrent UTI. Patient does not have a history of pyelonephritis.   The following portions of the patient's history were reviewed and updated as appropriate: allergies, current medications, past family history, past medical history, past social history, past surgical history and problem list.  Review of Systems Pertinent items are noted in HPI.    Objective:    BP 114/80  Pulse 81  Temp(Src) 98.4 F (36.9 C) (Oral)  Wt 148 lb 9.6 oz (67.405 kg)  SpO2 98% General appearance: alert, cooperative, appears stated age and no distress Head: Normocephalic, without obvious abnormality, atraumatic Abdomen: soft, non-tender; bowel sounds normal; no masses,  no organomegaly  Laboratory:  Urine dipstick: mod for hemoglobin, 3+ for leukocyte esterase and positive for nitrites.   Micro exam: not done.    Assessment:    Acute cystitis and UTI     Plan:    Medications: ciprofloxacin. Maintain adequate hydration. Follow up if symptoms not improving, and as needed.

## 2011-10-09 LAB — URINE CULTURE: Colony Count: >=100000

## 2011-12-20 ENCOUNTER — Encounter: Payer: BC Managed Care – PPO | Admitting: Family Medicine

## 2011-12-21 ENCOUNTER — Other Ambulatory Visit: Payer: Self-pay | Admitting: Family Medicine

## 2012-01-30 ENCOUNTER — Ambulatory Visit (INDEPENDENT_AMBULATORY_CARE_PROVIDER_SITE_OTHER): Payer: BC Managed Care – PPO | Admitting: Family Medicine

## 2012-01-30 ENCOUNTER — Other Ambulatory Visit (HOSPITAL_COMMUNITY)
Admission: RE | Admit: 2012-01-30 | Discharge: 2012-01-30 | Disposition: A | Discharge status: Home / Self Care | Payer: BC Managed Care – PPO | Source: Ambulatory Visit | Attending: Family Medicine | Admitting: Family Medicine

## 2012-01-30 ENCOUNTER — Encounter: Payer: Self-pay | Admitting: Family Medicine

## 2012-01-30 VITALS — BP 116/72 | HR 96 | Temp 98.7°F | Ht 62.5 in | Wt 145.0 lb

## 2012-01-30 DIAGNOSIS — Z1151 Encounter for screening for human papillomavirus (HPV): Secondary | ICD-10-CM | POA: Insufficient documentation

## 2012-01-30 DIAGNOSIS — Z0000 Encounter for general adult medical examination without abnormal findings: Secondary | ICD-10-CM

## 2012-01-30 DIAGNOSIS — Z124 Encounter for screening for malignant neoplasm of cervix: Secondary | ICD-10-CM

## 2012-01-30 DIAGNOSIS — Z01419 Encounter for gynecological examination (general) (routine) without abnormal findings: Secondary | ICD-10-CM | POA: Insufficient documentation

## 2012-01-30 DIAGNOSIS — N390 Urinary tract infection, site not specified: Secondary | ICD-10-CM

## 2012-01-30 LAB — POCT URINALYSIS DIPSTICK
Bilirubin, UA: NEGATIVE
Glucose, UA: NEGATIVE
Ketones, UA: NEGATIVE
Nitrite, UA: POSITIVE
Protein, UA: NEGATIVE
Spec Grav, UA: 1.015
Urobilinogen, UA: 0.2
pH, UA: 7

## 2012-01-30 LAB — CBC WITH DIFFERENTIAL/PLATELET
Basophils Absolute: 0 10*3/uL (ref 0.0–0.1)
Basophils Relative: 0.5 % (ref 0.0–3.0)
Eosinophils Absolute: 0.1 10*3/uL (ref 0.0–0.7)
Eosinophils Relative: 1.3 % (ref 0.0–5.0)
HCT: 41.9 % (ref 36.0–46.0)
Hemoglobin: 13.9 g/dL (ref 12.0–15.0)
Lymphocytes Relative: 36.8 % (ref 12.0–46.0)
Lymphs Abs: 2.9 10*3/uL (ref 0.7–4.0)
MCHC: 33.1 g/dL (ref 30.0–36.0)
MCV: 91.1 fl (ref 78.0–100.0)
Monocytes Absolute: 0.4 10*3/uL (ref 0.1–1.0)
Monocytes Relative: 5.2 % (ref 3.0–12.0)
Neutro Abs: 4.4 10*3/uL (ref 1.4–7.7)
Neutrophils Relative %: 56.2 % (ref 43.0–77.0)
Platelets: 224 10*3/uL (ref 150.0–400.0)
RBC: 4.6 Mil/uL (ref 3.87–5.11)
RDW: 13.1 % (ref 11.5–14.6)
WBC: 7.8 10*3/uL (ref 4.5–10.5)

## 2012-01-30 LAB — HEPATIC FUNCTION PANEL
ALT: 16 U/L (ref 0–35)
AST: 20 U/L (ref 0–37)
Albumin: 4.1 g/dL (ref 3.5–5.2)
Alkaline Phosphatase: 45 U/L (ref 39–117)
Bilirubin, Direct: 0.1 mg/dL (ref 0.0–0.3)
Total Bilirubin: 0.7 mg/dL (ref 0.3–1.2)
Total Protein: 7.3 g/dL (ref 6.0–8.3)

## 2012-01-30 LAB — LIPID PANEL
Cholesterol: 202 mg/dL — ABNORMAL HIGH (ref 0–200)
HDL: 64.9 mg/dL (ref >39.00)
Total CHOL/HDL Ratio: 3
Triglycerides: 46 mg/dL (ref 0.0–149.0)
VLDL: 9.2 mg/dL (ref 0.0–40.0)

## 2012-01-30 LAB — BASIC METABOLIC PANEL
BUN: 13 mg/dL (ref 6–23)
CO2: 30 mEq/L (ref 19–32)
Calcium: 9.3 mg/dL (ref 8.4–10.5)
Chloride: 104 mEq/L (ref 96–112)
Creatinine, Ser: 0.8 mg/dL (ref 0.4–1.2)
GFR: 91.51 mL/min (ref >60.00)
Glucose, Bld: 84 mg/dL (ref 70–99)
Potassium: 3.7 mEq/L (ref 3.5–5.1)
Sodium: 141 mEq/L (ref 135–145)

## 2012-01-30 LAB — TSH: TSH: 1.16 u[IU]/mL (ref 0.35–5.50)

## 2012-01-30 MED ORDER — NORGESTIM-ETH ESTRAD TRIPHASIC 0.18/0.215/0.25 MG-35 MCG PO TABS: 1.0000 | ORAL_TABLET | Freq: Every day | ORAL | Status: DC

## 2012-01-30 NOTE — Patient Instructions (Signed)
Preventive Care for Adults, Female A healthy lifestyle and preventive care can promote health and wellness. Preventive health guidelines for women include the following key practices.  A routine yearly physical is a good way to check with your caregiver about your health and preventive screening. It is a chance to share any concerns and updates on your health, and to receive a thorough exam.   Visit your dentist for a routine exam and preventive care every 6 months. Brush your teeth twice a day and floss once a day. Good oral hygiene prevents tooth decay and gum disease.   The frequency of eye exams is based on your age, health, family medical history, use of contact lenses, and other factors. Follow your caregiver's recommendations for frequency of eye exams.   Eat a healthy diet. Foods like vegetables, fruits, whole grains, low-fat dairy products, and lean protein foods contain the nutrients you need without too many calories. Decrease your intake of foods high in solid fats, added sugars, and salt. Eat the right amount of calories for you.Get information about a proper diet from your caregiver, if necessary.   Regular physical exercise is one of the most important things you can do for your health. Most adults should get at least 150 minutes of moderate-intensity exercise (any activity that increases your heart rate and causes you to sweat) each week. In addition, most adults need muscle-strengthening exercises on 2 or more days a week.   Maintain a healthy weight. The body mass index (BMI) is a screening tool to identify possible weight problems. It provides an estimate of body fat based on height and weight. Your caregiver can help determine your BMI, and can help you achieve or maintain a healthy weight.For adults 20 years and older:   A BMI below 18.5 is considered underweight.   A BMI of 18.5 to 24.9 is normal.   A BMI of 25 to 29.9 is considered overweight.   A BMI of 30 and above is  considered obese.   Maintain normal blood lipids and cholesterol levels by exercising and minimizing your intake of saturated fat. Eat a balanced diet with plenty of fruit and vegetables. Blood tests for lipids and cholesterol should begin at age 20 and be repeated every 5 years. If your lipid or cholesterol levels are high, you are over 50, or you are at high risk for heart disease, you may need your cholesterol levels checked more frequently.Ongoing high lipid and cholesterol levels should be treated with medicines if diet and exercise are not effective.   If you smoke, find out from your caregiver how to quit. If you do not use tobacco, do not start.   If you are pregnant, do not drink alcohol. If you are breastfeeding, be very cautious about drinking alcohol. If you are not pregnant and choose to drink alcohol, do not exceed 1 drink per day. One drink is considered to be 12 ounces (355 mL) of beer, 5 ounces (148 mL) of wine, or 1.5 ounces (44 mL) of liquor.   Avoid use of street drugs. Do not share needles with anyone. Ask for help if you need support or instructions about stopping the use of drugs.   High blood pressure causes heart disease and increases the risk of stroke. Your blood pressure should be checked at least every 1 to 2 years. Ongoing high blood pressure should be treated with medicines if weight loss and exercise are not effective.   If you are 55 to 25   years old, ask your caregiver if you should take aspirin to prevent strokes.   Diabetes screening involves taking a blood sample to check your fasting blood sugar level. This should be done once every 3 years, after age 45, if you are within normal weight and without risk factors for diabetes. Testing should be considered at a younger age or be carried out more frequently if you are overweight and have at least 1 risk factor for diabetes.   Breast cancer screening is essential preventive care for women. You should practice "breast  self-awareness." This means understanding the normal appearance and feel of your breasts and may include breast self-examination. Any changes detected, no matter how small, should be reported to a caregiver. Women in their 20s and 30s should have a clinical breast exam (CBE) by a caregiver as part of a regular health exam every 1 to 3 years. After age 40, women should have a CBE every year. Starting at age 40, women should consider having a mammography (breast X-ray test) every year. Women who have a family history of breast cancer should talk to their caregiver about genetic screening. Women at a high risk of breast cancer should talk to their caregivers about having magnetic resonance imaging (MRI) and a mammography every year.   The Pap test is a screening test for cervical cancer. A Pap test can show cell changes on the cervix that might become cervical cancer if left untreated. A Pap test is a procedure in which cells are obtained and examined from the lower end of the uterus (cervix).   Women should have a Pap test starting at age 21.   Between ages 21 and 29, Pap tests should be repeated every 2 years.   Beginning at age 30, you should have a Pap test every 3 years as long as the past 3 Pap tests have been normal.   Some women have medical problems that increase the chance of getting cervical cancer. Talk to your caregiver about these problems. It is especially important to talk to your caregiver if a new problem develops soon after your last Pap test. In these cases, your caregiver may recommend more frequent screening and Pap tests.   The above recommendations are the same for women who have or have not gotten the vaccine for human papillomavirus (HPV).   If you had a hysterectomy for a problem that was not cancer or a condition that could lead to cancer, then you no longer need Pap tests. Even if you no longer need a Pap test, a regular exam is a good idea to make sure no other problems are  starting.   If you are between ages 65 and 70, and you have had normal Pap tests going back 10 years, you no longer need Pap tests. Even if you no longer need a Pap test, a regular exam is a good idea to make sure no other problems are starting.   If you have had past treatment for cervical cancer or a condition that could lead to cancer, you need Pap tests and screening for cancer for at least 20 years after your treatment.   If Pap tests have been discontinued, risk factors (such as a new sexual partner) need to be reassessed to determine if screening should be resumed.   The HPV test is an additional test that may be used for cervical cancer screening. The HPV test looks for the virus that can cause the cell changes on the cervix.   The cells collected during the Pap test can be tested for HPV. The HPV test could be used to screen women aged 30 years and older, and should be used in women of any age who have unclear Pap test results. After the age of 30, women should have HPV testing at the same frequency as a Pap test.   Colorectal cancer can be detected and often prevented. Most routine colorectal cancer screening begins at the age of 50 and continues through age 75. However, your caregiver may recommend screening at an earlier age if you have risk factors for colon cancer. On a yearly basis, your caregiver may provide home test kits to check for hidden blood in the stool. Use of a small camera at the end of a tube, to directly examine the colon (sigmoidoscopy or colonoscopy), can detect the earliest forms of colorectal cancer. Talk to your caregiver about this at age 50, when routine screening begins. Direct examination of the colon should be repeated every 5 to 10 years through age 75, unless early forms of pre-cancerous polyps or small growths are found.   Hepatitis C blood testing is recommended for all people born from 1945 through 1965 and any individual with known risks for hepatitis C.    Practice safe sex. Use condoms and avoid high-risk sexual practices to reduce the spread of sexually transmitted infections (STIs). STIs include gonorrhea, chlamydia, syphilis, trichomonas, herpes, HPV, and human immunodeficiency virus (HIV). Herpes, HIV, and HPV are viral illnesses that have no cure. They can result in disability, cancer, and death. Sexually active women aged 25 and younger should be checked for chlamydia. Older women with new or multiple partners should also be tested for chlamydia. Testing for other STIs is recommended if you are sexually active and at increased risk.   Osteoporosis is a disease in which the bones lose minerals and strength with aging. This can result in serious bone fractures. The risk of osteoporosis can be identified using a bone density scan. Women ages 65 and over and women at risk for fractures or osteoporosis should discuss screening with their caregivers. Ask your caregiver whether you should take a calcium supplement or vitamin D to reduce the rate of osteoporosis.   Menopause can be associated with physical symptoms and risks. Hormone replacement therapy is available to decrease symptoms and risks. You should talk to your caregiver about whether hormone replacement therapy is right for you.   Use sunscreen with sun protection factor (SPF) of 30 or more. Apply sunscreen liberally and repeatedly throughout the day. You should seek shade when your shadow is shorter than you. Protect yourself by wearing long sleeves, pants, a wide-brimmed hat, and sunglasses year round, whenever you are outdoors.   Once a month, do a whole body skin exam, using a mirror to look at the skin on your back. Notify your caregiver of new moles, moles that have irregular borders, moles that are larger than a pencil eraser, or moles that have changed in shape or color.   Stay current with required immunizations.   Influenza. You need a dose every fall (or winter). The composition of  the flu vaccine changes each year, so being vaccinated once is not enough.   Pneumococcal polysaccharide. You need 1 to 2 doses if you smoke cigarettes or if you have certain chronic medical conditions. You need 1 dose at age 65 (or older) if you have never been vaccinated.   Tetanus, diphtheria, pertussis (Tdap, Td). Get 1 dose of   Tdap vaccine if you are younger than age 65, are over 65 and have contact with an infant, are a healthcare worker, are pregnant, or simply want to be protected from whooping cough. After that, you need a Td booster dose every 10 years. Consult your caregiver if you have not had at least 3 tetanus and diphtheria-containing shots sometime in your life or have a deep or dirty wound.   HPV. You need this vaccine if you are a woman age 26 or younger. The vaccine is given in 3 doses over 6 months.   Measles, mumps, rubella (MMR). You need at least 1 dose of MMR if you were born in 1957 or later. You may also need a second dose.   Meningococcal. If you are age 19 to 21 and a first-year college student living in a residence hall, or have one of several medical conditions, you need to get vaccinated against meningococcal disease. You may also need additional booster doses.   Zoster (shingles). If you are age 60 or older, you should get this vaccine.   Varicella (chickenpox). If you have never had chickenpox or you were vaccinated but received only 1 dose, talk to your caregiver to find out if you need this vaccine.   Hepatitis A. You need this vaccine if you have a specific risk factor for hepatitis A virus infection or you simply wish to be protected from this disease. The vaccine is usually given as 2 doses, 6 to 18 months apart.   Hepatitis B. You need this vaccine if you have a specific risk factor for hepatitis B virus infection or you simply wish to be protected from this disease. The vaccine is given in 3 doses, usually over 6 months.  Preventive Services /  Frequency Ages 19 to 39  Blood pressure check.** / Every 1 to 2 years.   Lipid and cholesterol check.** / Every 5 years beginning at age 20.   Clinical breast exam.** / Every 3 years for women in their 20s and 30s.   Pap test.** / Every 2 years from ages 21 through 29. Every 3 years starting at age 30 through age 65 or 70 with a history of 3 consecutive normal Pap tests.   HPV screening.** / Every 3 years from ages 30 through ages 65 to 70 with a history of 3 consecutive normal Pap tests.   Hepatitis C blood test.** / For any individual with known risks for hepatitis C.   Skin self-exam. / Monthly.   Influenza immunization.** / Every year.   Pneumococcal polysaccharide immunization.** / 1 to 2 doses if you smoke cigarettes or if you have certain chronic medical conditions.   Tetanus, diphtheria, pertussis (Tdap, Td) immunization. / A one-time dose of Tdap vaccine. After that, you need a Td booster dose every 10 years.   HPV immunization. / 3 doses over 6 months, if you are 26 and younger.   Measles, mumps, rubella (MMR) immunization. / You need at least 1 dose of MMR if you were born in 1957 or later. You may also need a second dose.   Meningococcal immunization. / 1 dose if you are age 19 to 21 and a first-year college student living in a residence hall, or have one of several medical conditions, you need to get vaccinated against meningococcal disease. You may also need additional booster doses.   Varicella immunization.** / Consult your caregiver.   Hepatitis A immunization.** / Consult your caregiver. 2 doses, 6 to 18 months   apart.   Hepatitis B immunization.** / Consult your caregiver. 3 doses usually over 6 months.  Ages 40 to 64  Blood pressure check.** / Every 1 to 2 years.   Lipid and cholesterol check.** / Every 5 years beginning at age 20.   Clinical breast exam.** / Every year after age 40.   Mammogram.** / Every year beginning at age 40 and continuing for as  long as you are in good health. Consult with your caregiver.   Pap test.** / Every 3 years starting at age 30 through age 65 or 70 with a history of 3 consecutive normal Pap tests.   HPV screening.** / Every 3 years from ages 30 through ages 65 to 70 with a history of 3 consecutive normal Pap tests.   Fecal occult blood test (FOBT) of stool. / Every year beginning at age 50 and continuing until age 75. You may not need to do this test if you get a colonoscopy every 10 years.   Flexible sigmoidoscopy or colonoscopy.** / Every 5 years for a flexible sigmoidoscopy or every 10 years for a colonoscopy beginning at age 50 and continuing until age 75.   Hepatitis C blood test.** / For all people born from 1945 through 1965 and any individual with known risks for hepatitis C.   Skin self-exam. / Monthly.   Influenza immunization.** / Every year.   Pneumococcal polysaccharide immunization.** / 1 to 2 doses if you smoke cigarettes or if you have certain chronic medical conditions.   Tetanus, diphtheria, pertussis (Tdap, Td) immunization.** / A one-time dose of Tdap vaccine. After that, you need a Td booster dose every 10 years.   Measles, mumps, rubella (MMR) immunization. / You need at least 1 dose of MMR if you were born in 1957 or later. You may also need a second dose.   Varicella immunization.** / Consult your caregiver.   Meningococcal immunization.** / Consult your caregiver.   Hepatitis A immunization.** / Consult your caregiver. 2 doses, 6 to 18 months apart.   Hepatitis B immunization.** / Consult your caregiver. 3 doses, usually over 6 months.  Ages 65 and over  Blood pressure check.** / Every 1 to 2 years.   Lipid and cholesterol check.** / Every 5 years beginning at age 20.   Clinical breast exam.** / Every year after age 40.   Mammogram.** / Every year beginning at age 40 and continuing for as long as you are in good health. Consult with your caregiver.   Pap test.** /  Every 3 years starting at age 30 through age 65 or 70 with a 3 consecutive normal Pap tests. Testing can be stopped between 65 and 70 with 3 consecutive normal Pap tests and no abnormal Pap or HPV tests in the past 10 years.   HPV screening.** / Every 3 years from ages 30 through ages 65 or 70 with a history of 3 consecutive normal Pap tests. Testing can be stopped between 65 and 70 with 3 consecutive normal Pap tests and no abnormal Pap or HPV tests in the past 10 years.   Fecal occult blood test (FOBT) of stool. / Every year beginning at age 50 and continuing until age 75. You may not need to do this test if you get a colonoscopy every 10 years.   Flexible sigmoidoscopy or colonoscopy.** / Every 5 years for a flexible sigmoidoscopy or every 10 years for a colonoscopy beginning at age 50 and continuing until age 75.   Hepatitis   C blood test.** / For all people born from 1945 through 1965 and any individual with known risks for hepatitis C.   Osteoporosis screening.** / A one-time screening for women ages 65 and over and women at risk for fractures or osteoporosis.   Skin self-exam. / Monthly.   Influenza immunization.** / Every year.   Pneumococcal polysaccharide immunization.** / 1 dose at age 65 (or older) if you have never been vaccinated.   Tetanus, diphtheria, pertussis (Tdap, Td) immunization. / A one-time dose of Tdap vaccine if you are over 65 and have contact with an infant, are a healthcare worker, or simply want to be protected from whooping cough. After that, you need a Td booster dose every 10 years.   Varicella immunization.** / Consult your caregiver.   Meningococcal immunization.** / Consult your caregiver.   Hepatitis A immunization.** / Consult your caregiver. 2 doses, 6 to 18 months apart.   Hepatitis B immunization.** / Check with your caregiver. 3 doses, usually over 6 months.  ** Family history and personal history of risk and conditions may change your caregiver's  recommendations. Document Released: 08/15/2001 Document Revised: 06/08/2011 Document Reviewed: 11/14/2010 Exit[employer] Patient Information 2012 [employer]. 

## 2012-01-30 NOTE — Progress Notes (Signed)
Subjective:     Margaret Horton is a 26 y.o. female and is here for a comprehensive physical exam. The patient reports no problems.  History   Social History  . Marital Status: Single    Spouse Name: N/A    Number of Children: N/A  . Years of Education: N/A   Occupational History  . [employer] --aesthetician    Social History Main Topics  . Smoking status: Former Smoker -- 0.1 packs/day for 4 years    Types: Cigarettes    Quit date: 01/30/2011  . Smokeless tobacco: Never Used  . Alcohol Use: 0.5 oz/week    1 drink(s) per week     very rare  . Drug Use: No  . Sexually Active: Yes -- Female partner(s)    Birth Control/ Protection: Pill   Other Topics Concern  . Not on file   Social History Narrative   Exercising 20 min a day   Health Maintenance  Topic Date Due  . Influenza Vaccine  04/02/2012  . Pap Smear  01/30/2015  . Tetanus/tdap  07/28/2019    The following portions of the patient's history were reviewed and updated as appropriate: allergies, current medications, past family history, past medical history, past social history, past surgical history and problem list.  Review of Systems Review of Systems  Constitutional: Negative for activity change, appetite change and fatigue.  HENT: Negative for hearing loss, congestion, tinnitus and ear discharge.  dentist q59m Eyes: Negative for visual disturbance (see optho q2y -- vision corrected to 20/20 with glasses).  Respiratory: Negative for cough, chest tightness and shortness of breath.   Cardiovascular: Negative for chest pain, palpitations and leg swelling.  Gastrointestinal: Negative for abdominal pain, diarrhea, constipation and abdominal distention.  Genitourinary: Negative for urgency, frequency, decreased urine volume and difficulty urinating.  Musculoskeletal: Negative for back pain, arthralgias and gait problem.  Skin: Negative for color change, pallor and rash.  Neurological: Negative for dizziness,  light-headedness, numbness and headaches.  Hematological: Negative for adenopathy. Does not bruise/bleed easily.  Psychiatric/Behavioral: Negative for suicidal ideas, confusion, sleep disturbance, self-injury, dysphoric mood, decreased concentration and agitation.      Objective:    BP 116/72  Pulse 96  Temp 98.7 F (37.1 C) (Oral)  Ht 5' 2.5" (1.588 m)  Wt 145 lb (65.772 kg)  BMI 26.10 kg/m2  SpO2 98%  LMP 01/24/2012 General appearance: alert, cooperative and appears stated age Head: Normocephalic, without obvious abnormality, atraumatic Eyes: conjunctivae/corneas clear. PERRL, EOM's intact. Fundi benign. Ears: normal TM's and external ear canals both ears Nose: Nares normal. Septum midline. Mucosa normal. No drainage or sinus tenderness. Throat: lips, mucosa, and tongue normal; teeth and gums normal Neck: no adenopathy, no carotid bruit, no JVD, supple, symmetrical, trachea midline and thyroid not enlarged, symmetric, no tenderness/mass/nodules Back: symmetric, no curvature. ROM normal. No CVA tenderness. Lungs: clear to auscultation bilaterally Breasts: normal appearance, no masses or tenderness Heart: regular rate and rhythm, S1, S2 normal, no murmur, click, rub or gallop Abdomen: soft, non-tender; bowel sounds normal; no masses,  no organomegaly Pelvic: cervix normal in appearance, external genitalia normal, no adnexal masses or tenderness, no cervical motion tenderness, rectovaginal septum normal, uterus normal size, shape, and consistency and vagina normal without discharge Extremities: extremities normal, atraumatic, no cyanosis or edema Pulses: 2+ and symmetric Skin: Skin color, texture, turgor normal. No rashes or lesions Lymph nodes: Cervical, supraclavicular, and axillary nodes normal. Neurologic: Alert and oriented X 3, normal strength and tone. Normal symmetric  reflexes. Normal coordination and gait psych-- no depression or anxiety    Assessment:    Healthy  female exam.      Plan:    check labs ghm utd See After Visit Summary for Counseling Recommendations

## 2012-01-30 NOTE — Addendum Note (Signed)
Addended by: Silvio Pate D on: 01/30/2012 01:48 PM   Modules accepted: Orders

## 2012-01-31 LAB — LDL CHOLESTEROL, DIRECT: Direct LDL: 136.5 mg/dL

## 2012-02-02 ENCOUNTER — Telehealth: Payer: Self-pay | Admitting: *Deleted

## 2012-02-02 LAB — URINE CULTURE: Colony Count: >=100000

## 2012-02-02 NOTE — Telephone Encounter (Signed)
Pt called to advise that [facility] gave her an error message that her SSN is incorrect, placed pt correct SSN in chart per noted on #8 all the way across for SSN at this time, chart accepted change and pt was advised it can take up to 24hours to accept change in the system, noted the results have not been noted at this time, advised if pt does not get into [facility] after 24hours to call number located on discharge summery to receive assistance from them, pt understood, also advised if still no response noted  She can call our office back to clarify, pt understood.

## 2012-02-04 ENCOUNTER — Other Ambulatory Visit: Payer: Self-pay | Admitting: Family Medicine

## 2012-02-04 DIAGNOSIS — N390 Urinary tract infection, site not specified: Secondary | ICD-10-CM

## 2012-02-04 MED ORDER — CIPROFLOXACIN HCL 500 MG PO TABS: 500.0000 mg | ORAL_TABLET | Freq: Two times a day (BID) | ORAL | Status: AC

## 2012-02-05 ENCOUNTER — Encounter: Payer: Self-pay | Admitting: *Deleted

## 2012-08-23 ENCOUNTER — Ambulatory Visit: Payer: BC Managed Care – PPO | Admitting: Family Medicine

## 2012-10-16 ENCOUNTER — Ambulatory Visit: Payer: BC Managed Care – PPO | Admitting: Family Medicine

## 2013-05-08 ENCOUNTER — Other Ambulatory Visit: Payer: Self-pay

## 2013-05-15 ENCOUNTER — Other Ambulatory Visit: Payer: Self-pay | Admitting: Family Medicine

## 2013-05-15 NOTE — Telephone Encounter (Signed)
Tri sprintec -refilled.

## 2013-08-11 ENCOUNTER — Telehealth: Payer: Self-pay

## 2013-08-11 NOTE — Telephone Encounter (Signed)
Left message for call back. Identifiable  Pap- 01/30/12- atypical squamous cells of undetermined significance. Flu-due Tdap- 07/27/09

## 2013-08-12 ENCOUNTER — Ambulatory Visit (INDEPENDENT_AMBULATORY_CARE_PROVIDER_SITE_OTHER): Payer: BC Managed Care – PPO | Admitting: Family Medicine

## 2013-08-12 ENCOUNTER — Encounter: Payer: Self-pay | Admitting: Family Medicine

## 2013-08-12 ENCOUNTER — Other Ambulatory Visit (HOSPITAL_COMMUNITY)
Admission: RE | Admit: 2013-08-12 | Discharge: 2013-08-12 | Disposition: A | Discharge status: Home / Self Care | Payer: BC Managed Care – PPO | Source: Ambulatory Visit | Attending: Family Medicine | Admitting: Family Medicine

## 2013-08-12 VITALS — BP 110/68 | HR 97 | Temp 98.2°F | Ht 62.0 in | Wt 147.2 lb

## 2013-08-12 DIAGNOSIS — Z124 Encounter for screening for malignant neoplasm of cervix: Secondary | ICD-10-CM | POA: Insufficient documentation

## 2013-08-12 DIAGNOSIS — Z0000 Encounter for general adult medical examination without abnormal findings: Secondary | ICD-10-CM

## 2013-08-12 DIAGNOSIS — Z1151 Encounter for screening for human papillomavirus (HPV): Secondary | ICD-10-CM | POA: Insufficient documentation

## 2013-08-12 LAB — BASIC METABOLIC PANEL
BUN: 7 mg/dL (ref 6–23)
CO2: 27 mEq/L (ref 19–32)
Calcium: 9 mg/dL (ref 8.4–10.5)
Chloride: 103 mEq/L (ref 96–112)
Creatinine, Ser: 0.7 mg/dL (ref 0.4–1.2)
GFR: 103.57 mL/min (ref >60.00)
Glucose, Bld: 98 mg/dL (ref 70–99)
Potassium: 4 mEq/L (ref 3.5–5.1)
Sodium: 138 mEq/L (ref 135–145)

## 2013-08-12 LAB — CBC WITH DIFFERENTIAL/PLATELET
Basophils Absolute: 0 10*3/uL (ref 0.0–0.1)
Basophils Relative: 0.5 % (ref 0.0–3.0)
Eosinophils Absolute: 0.1 10*3/uL (ref 0.0–0.7)
Eosinophils Relative: 2.2 % (ref 0.0–5.0)
HCT: 43.1 % (ref 36.0–46.0)
Hemoglobin: 14.3 g/dL (ref 12.0–15.0)
Lymphocytes Relative: 33.3 % (ref 12.0–46.0)
Lymphs Abs: 2.1 10*3/uL (ref 0.7–4.0)
MCHC: 33 g/dL (ref 30.0–36.0)
MCV: 91.5 fl (ref 78.0–100.0)
Monocytes Absolute: 0.3 10*3/uL (ref 0.1–1.0)
Monocytes Relative: 4.5 % (ref 3.0–12.0)
Neutro Abs: 3.7 10*3/uL (ref 1.4–7.7)
Neutrophils Relative %: 59.5 % (ref 43.0–77.0)
Platelets: 281 10*3/uL (ref 150.0–400.0)
RBC: 4.71 Mil/uL (ref 3.87–5.11)
RDW: 13.9 % (ref 11.5–14.6)
WBC: 6.2 10*3/uL (ref 4.5–10.5)

## 2013-08-12 LAB — LIPID PANEL
Cholesterol: 233 mg/dL — ABNORMAL HIGH (ref 0–200)
HDL: 66 mg/dL (ref >39.00)
Total CHOL/HDL Ratio: 4
Triglycerides: 62 mg/dL (ref 0.0–149.0)
VLDL: 12.4 mg/dL (ref 0.0–40.0)

## 2013-08-12 LAB — HEPATIC FUNCTION PANEL
ALT: 15 U/L (ref 0–35)
AST: 22 U/L (ref 0–37)
Albumin: 3.7 g/dL (ref 3.5–5.2)
Alkaline Phosphatase: 42 U/L (ref 39–117)
Bilirubin, Direct: 0.1 mg/dL (ref 0.0–0.3)
Total Bilirubin: 0.9 mg/dL (ref 0.3–1.2)
Total Protein: 7.2 g/dL (ref 6.0–8.3)

## 2013-08-12 LAB — LDL CHOLESTEROL, DIRECT: Direct LDL: 163.5 mg/dL

## 2013-08-12 LAB — POCT URINALYSIS DIPSTICK
Bilirubin, UA: NEGATIVE
Blood, UA: NEGATIVE
Glucose, UA: NEGATIVE
Ketones, UA: NEGATIVE
Leukocytes, UA: NEGATIVE
Nitrite, UA: NEGATIVE
Protein, UA: NEGATIVE
Spec Grav, UA: <=1.005
Urobilinogen, UA: 0.2
pH, UA: 6.5

## 2013-08-12 LAB — TSH: TSH: 1.38 u[IU]/mL (ref 0.35–5.50)

## 2013-08-12 MED ORDER — NORGESTIM-ETH ESTRAD TRIPHASIC 0.18/0.215/0.25 MG-35 MCG PO TABS: ORAL_TABLET | ORAL | Status: DC

## 2013-08-12 NOTE — Progress Notes (Signed)
Subjective:     Margaret Horton is a 28 y.o. female and is here for a comprehensive physical exam. The patient reports no problems.  History   Social History  . Marital Status: Single    Spouse Name: N/A    Number of Children: N/A  . Years of Education: N/A   Occupational History  . [employer]    Social History Main Topics  . Smoking status: Former Smoker -- 0.10 packs/day for 4 years    Types: Cigarettes    Quit date: 01/30/2011  . Smokeless tobacco: Never Used  . Alcohol Use: 0.5 oz/week    1 drink(s) per week     Comment: very rare  . Drug Use: No  . Sexual Activity: Yes    Partners: Male    Birth Control/ Protection: Pill   Other Topics Concern  . Not on file   Social History Narrative   Exercising 20 min a day   Health Maintenance  Topic Date Due  . Influenza Vaccine  08/12/2014  . Pap Smear  01/30/2015  . Tetanus/tdap  07/28/2019    The following portions of the patient's history were reviewed and updated as appropriate:  She  has a past medical history of Attention deficit disorder (ADD) and Anxiety. She  does not have any pertinent problems on file. She  has no past surgical history on file. Her family history includes Cancer (age of onset: 1148) in her father; Diabetes in her paternal grandfather; Hypertension in her father and paternal grandfather; Kidney cancer in her father; Lymphoma in an other family member. She  reports that she quit smoking about 2 years ago. Her smoking use included Cigarettes. She has a .4 pack-year smoking history. She has never used smokeless tobacco. She reports that she drinks about 0.5 ounces of alcohol per week. She reports that she does not use illicit drugs. She has a current medication list which includes the following prescription(s): tri-sprintec. Current Outpatient Prescriptions on File Prior to Visit  Medication Sig Dispense Refill  . TRI-SPRINTEC 0.18/0.215/0.25 MG-35 MCG tablet TAKE ONE TABLET BY MOUTH EVERY DAY   84 tablet  0   No current facility-administered medications on file prior to visit.   She has No Known Allergies..  Review of Systems Review of Systems  Constitutional: Negative for activity change, appetite change and fatigue.  HENT: Negative for hearing loss, congestion, tinnitus and ear discharge.  dentist [id number] Eyes: Negative for visual disturbance (see optho q1y -- vision corrected to 20/20 with glasses).  Respiratory: Negative for cough, chest tightness and shortness of breath.   Cardiovascular: Negative for chest pain, palpitations and leg swelling.  Gastrointestinal: Negative for abdominal pain, diarrhea, constipation and abdominal distention.  Genitourinary: Negative for urgency, frequency, decreased urine volume and difficulty urinating.  Musculoskeletal: Negative for back pain, arthralgias and gait problem.  Skin: Negative for color change, pallor and rash.  Neurological: Negative for dizziness, light-headedness, numbness and headaches.  Hematological: Negative for adenopathy. Does not bruise/bleed easily.  Psychiatric/Behavioral: Negative for suicidal ideas, confusion, sleep disturbance, self-injury, dysphoric mood, decreased concentration and agitation.      Objective:    BP 110/68  Pulse 97  Temp(Src) 98.2 F (36.8 C) (Oral)  Ht 5\' 2"  (1.575 m)  Wt 147 lb 3.2 oz (66.769 kg)  BMI 26.92 kg/m2  SpO2 99%  LMP 08/09/2013 General appearance: alert, cooperative, appears stated age and no distress Head: Normocephalic, without obvious abnormality, atraumatic Eyes: conjunctivae/corneas clear. PERRL, EOM's intact. Fundi benign.  Ears: normal TM's and external ear canals both ears Nose: Nares normal. Septum midline. Mucosa normal. No drainage or sinus tenderness. Throat: lips, mucosa, and tongue normal; teeth and gums normal Neck: no adenopathy, no carotid bruit, no JVD, supple, symmetrical, trachea midline and thyroid not enlarged, symmetric, no  tenderness/mass/nodules Back: symmetric, no curvature. ROM normal. No CVA tenderness. Lungs: clear to auscultation bilaterally Breasts: normal appearance, no masses or tenderness Heart: regular rate and rhythm, S1, S2 normal, no murmur, click, rub or gallop Abdomen: soft, non-tender; bowel sounds normal; no masses,  no organomegaly Pelvic: cervix normal in appearance, external genitalia normal, no adnexal masses or tenderness, no cervical motion tenderness, rectovaginal septum normal, uterus normal size, shape, and consistency, vagina normal without discharge and pap done Extremities: extremities normal, atraumatic, no cyanosis or edema Pulses: 2+ and symmetric Skin: Skin color, texture, turgor normal. No rashes or lesions Lymph nodes: Cervical, supraclavicular, and axillary nodes normal. Neurologic: Alert and oriented X 3, normal strength and tone. Normal symmetric reflexes. Normal coordination and gait Psych-- no depression, no anxiety      Assessment:    Healthy female exam.      Plan:    ghm utd Check labs See After Visit Summary for Counseling Recommendations

## 2013-08-12 NOTE — Addendum Note (Signed)
Addended by: Lelon PerlaLOWNE, Benett Swoyer R on: 08/12/2013 10:42 AM   Modules accepted: Orders

## 2013-08-12 NOTE — Progress Notes (Signed)
Pre visit review using our clinic review tool, if applicable. No additional management support is needed unless otherwise documented below in the visit note. 

## 2013-08-12 NOTE — Addendum Note (Signed)
Addended by: Arnette NorrisPAYNE, Tayia Stonesifer P on: 08/12/2013 11:29 AM   Modules accepted: Orders

## 2013-08-12 NOTE — Patient Instructions (Signed)

## 2013-08-15 NOTE — Telephone Encounter (Signed)
Unable to reach pre visit.  

## 2013-08-29 ENCOUNTER — Ambulatory Visit (INDEPENDENT_AMBULATORY_CARE_PROVIDER_SITE_OTHER): Payer: BC Managed Care – PPO | Admitting: Family Medicine

## 2013-08-29 ENCOUNTER — Encounter: Payer: Self-pay | Admitting: Family Medicine

## 2013-08-29 VITALS — BP 120/72 | HR 87 | Temp 98.6°F | Wt 156.0 lb

## 2013-08-29 DIAGNOSIS — N390 Urinary tract infection, site not specified: Secondary | ICD-10-CM

## 2013-08-29 DIAGNOSIS — R300 Dysuria: Secondary | ICD-10-CM

## 2013-08-29 LAB — POCT URINALYSIS DIPSTICK
Bilirubin, UA: NEGATIVE
Glucose, UA: NEGATIVE
Ketones, UA: NEGATIVE
Nitrite, UA: NEGATIVE
Protein, UA: NEGATIVE
Spec Grav, UA: <=1.005
Urobilinogen, UA: 0.2
pH, UA: 6

## 2013-08-29 MED ORDER — CIPROFLOXACIN HCL 500 MG PO TABS: 500.0000 mg | ORAL_TABLET | Freq: Two times a day (BID) | ORAL | Status: AC

## 2013-08-29 NOTE — Progress Notes (Signed)
  Subjective:    Margaret Horton is a 28 y.o. female who complains of burning with urination, frequency and urgency. She has had symptoms for 3 days. Patient also complains of na. Patient denies back pain, congestion, cough, fever, headache, rhinitis, sorethroat, stomach ache and vaginal discharge. Patient does not have a history of recurrent UTI. Patient does not have a history of pyelonephritis.   The following portions of the patient's history were reviewed and updated as appropriate: allergies, current medications, past family history, past medical history, past social history, past surgical history and problem list.  Review of Systems Pertinent items are noted in HPI.    Objective:    BP 120/72  Pulse 87  Temp(Src) 98.6 F (37 C) (Oral)  Wt 156 lb (70.761 kg)  SpO2 98%  LMP 08/09/2013 General appearance: alert, cooperative, appears stated age and no distress Abdomen: soft, non-tender; bowel sounds normal; no masses,  no organomegaly  Laboratory:  Urine dipstick: mod for hemoglobin and 3+ for leukocyte esterase.   Micro exam: not done.    Assessment:    Acute cystitis and UTI     Plan:    Medications: ciprofloxacin. Maintain adequate hydration. Follow up if symptoms not improving, and as needed.

## 2013-08-29 NOTE — Patient Instructions (Signed)
Urinary Tract Infection  Urinary tract infections (UTIs) can develop anywhere along your urinary tract. Your urinary tract is your body's drainage system for removing wastes and extra water. Your urinary tract includes two kidneys, two ureters, a bladder, and a urethra. Your kidneys are a pair of bean-shaped organs. Each kidney is about the size of your fist. They are located below your ribs, one on each side of your spine.  CAUSES  Infections are caused by microbes, which are microscopic organisms, including fungi, viruses, and bacteria. These organisms are so small that they can only be seen through a microscope. Bacteria are the microbes that most commonly cause UTIs.  SYMPTOMS   Symptoms of UTIs may vary by age and gender of the patient and by the location of the infection. Symptoms in young women typically include a frequent and intense urge to urinate and a painful, burning feeling in the bladder or urethra during urination. Older women and men are more likely to be tired, shaky, and weak and have muscle aches and abdominal pain. A fever may mean the infection is in your kidneys. Other symptoms of a kidney infection include pain in your back or sides below the ribs, nausea, and vomiting.  DIAGNOSIS  To diagnose a UTI, your caregiver will ask you about your symptoms. Your caregiver also will ask to provide a urine sample. The urine sample will be tested for bacteria and white blood cells. White blood cells are made by your body to help fight infection.  TREATMENT   Typically, UTIs can be treated with medication. Because most UTIs are caused by a bacterial infection, they usually can be treated with the use of antibiotics. The choice of antibiotic and length of treatment depend on your symptoms and the type of bacteria causing your infection.  HOME CARE INSTRUCTIONS   If you were prescribed antibiotics, take them exactly as your caregiver instructs you. Finish the medication even if you feel better after you  have only taken some of the medication.   Drink enough water and fluids to keep your urine clear or pale yellow.   Avoid caffeine, tea, and carbonated beverages. They tend to irritate your bladder.   Empty your bladder often. Avoid holding urine for long periods of time.   Empty your bladder before and after sexual intercourse.   After a bowel movement, women should cleanse from front to back. Use each tissue only once.  SEEK MEDICAL CARE IF:    You have back pain.   You develop a fever.   Your symptoms do not begin to resolve within 3 days.  SEEK IMMEDIATE MEDICAL CARE IF:    You have severe back pain or lower abdominal pain.   You develop chills.   You have nausea or vomiting.   You have continued burning or discomfort with urination.  MAKE SURE YOU:    Understand these instructions.   Will watch your condition.   Will get help right away if you are not doing well or get worse.  Document Released: 03/29/2005 Document Revised: 12/19/2011 Document Reviewed: 07/28/2011  ExitCare Patient Information 2014 [employer].

## 2013-08-29 NOTE — Progress Notes (Signed)
Pre visit review using our clinic review tool, if applicable. No additional management support is needed unless otherwise documented below in the visit note. 

## 2013-08-31 NOTE — Progress Notes (Signed)
  Subjective:    Margaret Horton is a 28 y.o. female who complains of burning with urination and frequency. She has had symptoms for several days. Patient also complains of no other symptoms. Patient denies back pain, congestion, cough, fever, headache, rhinitis, sorethroat, stomach ache and vaginal discharge. Patient does not have a history of recurrent UTI. Patient does not have a history of pyelonephritis.   The following portions of the patient's history were reviewed and updated as appropriate: allergies, current medications, past family history, past medical history, past social history, past surgical history and problem list.  Review of Systems Pertinent items are noted in HPI.    Objective:    BP 120/72  Pulse 87  Temp(Src) 98.6 F (37 C) (Oral)  Wt 156 lb (70.761 kg)  SpO2 98%  LMP 08/09/2013 General appearance: alert, cooperative, appears stated age and no distress Lungs: clear to auscultation bilaterally Heart: regular rate and rhythm, S1, S2 normal, no murmur, click, rub or gallop Abdomen: soft, non-tender; bowel sounds normal; no masses,  no organomegaly  Laboratory:  Urine dipstick: mod for hemoglobin and 3+ for leukocyte esterase.   Micro exam: not done.    Assessment:    UTI     Plan:    Medications: ciprofloxacin. Maintain adequate hydration. Follow up if symptoms not improving, and as needed.

## 2013-09-01 LAB — URINE CULTURE: Colony Count: >=100000

## 2013-10-29 ENCOUNTER — Ambulatory Visit (INDEPENDENT_AMBULATORY_CARE_PROVIDER_SITE_OTHER): Payer: BC Managed Care – PPO

## 2013-10-29 DIAGNOSIS — Z111 Encounter for screening for respiratory tuberculosis: Secondary | ICD-10-CM

## 2013-10-31 LAB — TB SKIN TEST
Induration: 0 mm
TB Skin Test: NEGATIVE

## 2014-04-17 ENCOUNTER — Other Ambulatory Visit: Payer: Self-pay

## 2014-10-20 ENCOUNTER — Other Ambulatory Visit: Payer: Self-pay

## 2014-10-20 MED ORDER — NORGESTIM-ETH ESTRAD TRIPHASIC 0.18/0.215/0.25 MG-35 MCG PO TABS: ORAL_TABLET | ORAL | Status: DC

## 2015-02-02 ENCOUNTER — Encounter: Payer: Self-pay | Admitting: Family Medicine

## 2015-02-02 ENCOUNTER — Ambulatory Visit (INDEPENDENT_AMBULATORY_CARE_PROVIDER_SITE_OTHER): Payer: BLUE CROSS/BLUE SHIELD | Admitting: Family Medicine

## 2015-02-02 VITALS — BP 110/62 | HR 76 | Temp 98.4°F | Ht 62.0 in | Wt 150.4 lb

## 2015-02-02 DIAGNOSIS — D72829 Elevated white blood cell count, unspecified: Secondary | ICD-10-CM

## 2015-02-02 DIAGNOSIS — F909 Attention-deficit hyperactivity disorder, unspecified type: Secondary | ICD-10-CM

## 2015-02-02 DIAGNOSIS — F988 Other specified behavioral and emotional disorders with onset usually occurring in childhood and adolescence: Secondary | ICD-10-CM

## 2015-02-02 LAB — POCT URINALYSIS DIPSTICK
Bilirubin, UA: NEGATIVE
Blood, UA: NEGATIVE
Glucose, UA: NEGATIVE
Ketones, UA: NEGATIVE
Nitrite, UA: NEGATIVE
Protein, UA: NEGATIVE
Spec Grav, UA: >=1.03
Urobilinogen, UA: 2
pH, UA: 5.5

## 2015-02-02 MED ORDER — AMPHETAMINE-DEXTROAMPHET ER 20 MG PO CP24: 20.0000 mg | ORAL_CAPSULE | ORAL | Status: DC

## 2015-02-02 NOTE — Progress Notes (Signed)
Pre visit review using our clinic review tool, if applicable. No additional management support is needed unless otherwise documented below in the visit note. 

## 2015-02-02 NOTE — Patient Instructions (Signed)
Amphetamine; Dextroamphetamine extended-release capsules What is this medicine? AMPHETAMINE; DEXTROAMPHETAMINE (am FET a meen; dex troe am FET a meen) is used to treat attention-deficit hyperactivity disorder (ADHD). Federal law prohibits giving this medicine to any person other than the person for whom it was prescribed. Do not share this medicine with anyone else. This medicine may be used for other purposes; ask your health care provider or pharmacist if you have questions. COMMON BRAND NAME(S): Adderall XR What should I tell my health care provider before I take this medicine? They need to know if you have any of these conditions: -anxiety or panic attacks -circulation problems in fingers and toes -glaucoma -hardening or blockages of the arteries or heart blood vessels -heart disease or a heart defect -high blood pressure -history of a drug or alcohol abuse problem -history of stroke -kidney disease -liver disease -mental illness -seizures -suicidal thoughts, plans, or attempt; a previous suicide attempt by you or a family member -thyroid disease -Tourette's syndrome -an unusual or allergic reaction to dextroamphetamine, other amphetamines, other medicines, foods, dyes, or preservatives -pregnant or trying to get pregnant -breast-feeding How should I use this medicine? Take this medicine by mouth with a glass of water. Follow the directions on the prescription label. This medicine is taken just one time per day, usually in the morning after waking up. Take with or without food. Do not chew or crush this medicine. You may open the capsules and sprinkle the medicine onto a spoon of applesauce. If sprinkled on applesauce, take the dose immediately and do not crush or chew. Always drink a glass of water or other liquid after taking this medicine. Do not take your medicine more often than directed. A special MedGuide will be given to you by the pharmacist with each prescription and refill.  Be sure to read this information carefully each time. Talk to your pediatrician regarding the use of this medicine in children. While this drug may be prescribed for children as young as 6 years for selected conditions, precautions do apply. Overdosage: If you think you have taken too much of this medicine contact a poison control center or emergency room at once. NOTE: This medicine is only for you. Do not share this medicine with others. What if I miss a dose? If you miss a dose, take it as soon as you can. If it is almost time for your next dose, take only that dose. Do not take double or extra doses. What may interact with this medicine? Do not take this medicine with any of the following medications: -certain medicines for migraine headache like almotriptan, eletriptan, frovatriptan, naratriptan, rizatriptan, sumatriptan, zolmitriptan -MAOIs like Carbex, Eldepryl, Marplan, Nardil, and Parnate -meperidine -other stimulant medicines for attention disorders, weight loss, or to stay awake -pimozide -procarbazine This medicine may also interact with the following medications: -acetazolamide -ammonium chloride -antacids -ascorbic acid -atomoxetine -caffeine -certain medicines for blood pressure -certain medicines for depression, anxiety, or psychotic disturbances -certain medicines for diabetes -certain medicines for seizures like carbamazepine, phenobarbital, phenytoin -certain medicines for stomach problems like cimetidine, famotidine, omeprazole, lansoprazole -cold or allergy medicines -glutamic acid -methenamine -narcotic medicines for pain -norepinephrine -phenothiazines like chlorpromazine, mesoridazine, prochlorperazine, thioridazine -sodium acid phosphate -sodium bicarbonate This list may not describe all possible interactions. Give your health care provider a list of all the medicines, herbs, non-prescription drugs, or dietary supplements you use. Also tell them if you  smoke, drink alcohol, or use illegal drugs. Some items may interact with your medicine. What should   I watch for while using this medicine? Visit your doctor or health care professional for regular checks on your progress. This prescription requires that you follow special procedures with your doctor and pharmacy. You will need to have a new written prescription from your doctor every time you need a refill. This medicine may affect your concentration, or hide signs of tiredness. Until you know how this medicine affects you, do not drive, ride a bicycle, use machinery, or do anything that needs mental alertness. Tell your doctor or health care professional if this medicine loses its effects, or if you feel you need to take more than the prescribed amount. Do not change the dosage without talking to your doctor or health care professional. Decreased appetite is a common side effect when starting this medicine. Eating small, frequent meals or snacks can help. Talk to your doctor if you continue to have poor eating habits. Height and weight growth of a child taking this medicine will be monitored closely. Do not take this medicine close to bedtime. It may prevent you from sleeping. If you are going to need surgery, an MRI, a CT scan, or other procedure, tell your doctor that you are taking this medicine. You may need to stop taking this medicine before the procedure. Tell your doctor or healthcare professional right away if you notice unexplained wounds on your fingers and toes while taking this medicine. You should also tell your healthcare provider if you experience numbness or pain, changes in the skin color, or sensitivity to temperature in your fingers or toes. What side effects may I notice from receiving this medicine? Side effects that you should report to your doctor or health care professional as soon as possible: -allergic reactions like skin rash, itching or hives, swelling of the face, lips, or  tongue -changes in vision -chest pain or chest tightness -confusion, trouble speaking or understanding -fast, irregular heartbeat -fingers or toes feel numb, cool, painful -hallucination, loss of contact with reality -high blood pressure -males: prolonged or painful erection -seizures -severe headaches -shortness of breath -suicidal thoughts or other mood changes -trouble walking, dizziness, loss of balance or coordination -uncontrollable head, mouth, neck, arm, or leg movements Side effects that usually do not require medical attention (report to your doctor or health care professional if they continue or are bothersome): -anxious -headache -loss of appetite -nausea, vomiting -trouble sleeping -weight loss This list may not describe all possible side effects. Call your doctor for medical advice about side effects. You may report side effects to FDA at 1-800-FDA-1088. Where should I keep my medicine? Keep out of the reach of children. This medicine can be abused. Keep your medicine in a safe place to protect it from theft. Do not share this medicine with anyone. Selling or giving away this medicine is dangerous and against the law. Store at room temperature between 15 and 30 degrees C (59 and 86 degrees F). Keep container tightly closed. Protect from light. Throw away any unused medicine after the expiration date. NOTE: This sheet is a summary. It may not cover all possible information. If you have questions about this medicine, talk to your doctor, pharmacist, or health care provider.  2015, [employer]vier/[employer]. (2013-05-02 18:16:00)  

## 2015-02-02 NOTE — Progress Notes (Signed)
Patient ID: Margaret Horton, female    DOB: [birth year]  Age: 29 y.o. MRN: [id number]    Subjective:  Subjective HPI Margaret Horton presents for f/u ADD.  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  She would like to restart the adderall.      Review of Systems  Constitutional: Negative for diaphoresis, appetite change, fatigue and unexpected weight change.  Eyes: Negative for pain, redness and visual disturbance.  Respiratory: Negative for cough, chest tightness, shortness of breath and wheezing.   Cardiovascular: Negative for chest pain, palpitations and leg swelling.  Endocrine: Negative for cold intolerance, heat intolerance, polydipsia, polyphagia and polyuria.  Genitourinary: Positive for dysuria. Negative for frequency and difficulty urinating.  Neurological: Negative for dizziness, light-headedness, numbness and headaches.    History Past Medical History  Diagnosis Date  . Attention deficit disorder (ADD)   . Anxiety     She has no past surgical history on file.   Her family history includes Cancer (age of onset: 65) in her father; Diabetes in her paternal grandfather; Hypertension in her father and paternal grandfather; Kidney cancer in her father; Lymphoma in an other family member.She reports that she quit smoking about 4 years ago. Her smoking use included Cigarettes. She has a .4 pack-year smoking history. She has never used smokeless tobacco. She reports that she drinks about 0.5 oz of alcohol per week. She reports that she does not use illicit drugs.  Current Outpatient Prescriptions on File Prior to Visit  Medication Sig Dispense Refill  . Norgestimate-Ethinyl Estradiol Triphasic (TRI-SPRINTEC) 0.18/0.215/0.25 MG-35 MCG tablet TAKE ONE TABLET BY MOUTH EVERY DAY. OFFICE VISIT DUE NOW 3 Package 0   No current facility-administered medications on file prior to visit.     Objective:  Objective Physical Exam  Constitutional: She is oriented to person,  place, and time. She appears well-developed and well-nourished.  HENT:  Head: Normocephalic and atraumatic.  Eyes: Conjunctivae and EOM are normal.  Neck: Normal range of motion. Neck supple. No JVD present. Carotid bruit is not present. No thyromegaly present.  Cardiovascular: Normal rate, regular rhythm and normal heart sounds.   No murmur heard. Pulmonary/Chest: Effort normal and breath sounds normal. No respiratory distress. She has no wheezes. She has no rales. She exhibits no tenderness.  Musculoskeletal: She exhibits no edema.  Neurological: She is alert and oriented to person, place, and time.  Psychiatric: She has a normal mood and affect.   BP 110/62 mmHg  Pulse 76  Temp(Src) 98.4 F (36.9 C) (Oral)  Ht  (1.575 m)  Wt 150 lb 6.4 oz (68.221 kg)  BMI 27.50 kg/m2  SpO2 99%  LMP 01/28/2015 Wt Readings from Last 3 Encounters:  02/02/15 150 lb 6.4 oz (68.221 kg)  08/29/13 156 lb (70.761 kg)  08/12/13 147 lb 3.2 oz (66.769 kg)     Lab Results  Component Value Date   WBC 6.2 08/12/2013   HGB 14.3 08/12/2013   HCT 43.1 08/12/2013   PLT 281.0 08/12/2013   GLUCOSE 98 08/12/2013   CHOL 233* 08/12/2013   TRIG 62.0 08/12/2013   HDL 66.00 08/12/2013   LDLDIRECT 163.5 08/12/2013   LDLCALC 123* 12/15/2010   ALT 15 08/12/2013   AST 22 08/12/2013   NA 138 08/12/2013   K 4.0 08/12/2013   CL 103 08/12/2013   CREATININE 0.7 08/12/2013   BUN 7 08/12/2013   CO2 27 08/12/2013   TSH 1.38 08/12/2013    No results found.  Assessment & Plan:  Plan I am having [name] start on amphetamine-dextroamphetamine, amphetamine-dextroamphetamine, and amphetamine-dextroamphetamine. I am also having her maintain her Norgestimate-Ethinyl Estradiol Triphasic.  Meds ordered this encounter  Medications  . amphetamine-dextroamphetamine (ADDERALL XR) 20 MG 24 hr capsule    Sig: Take 1 capsule (20 mg total) by mouth every morning.    Dispense:  30 capsule    Refill:  0  .  amphetamine-dextroamphetamine (ADDERALL XR) 20 MG 24 hr capsule    Sig: Take 1 capsule (20 mg total) by mouth every morning.    Dispense:  30 capsule    Refill:  0    Do not fill until September2, 2016  . amphetamine-dextroamphetamine (ADDERALL XR) 20 MG 24 hr capsule    Sig: Take 1 capsule (20 mg total) by mouth every morning.    Dispense:  30 capsule    Refill:  0    Do not fill until Apr 04, 2015    Problem List Items Addressed This Visit    Attention deficit disorder    Refill meds Recheck 6 months or sooner prn       Other Visit Diagnoses    ADD (attention deficit disorder)    -  Primary    Relevant Medications    amphetamine-dextroamphetamine (ADDERALL XR) 20 MG 24 hr capsule    amphetamine-dextroamphetamine (ADDERALL XR) 20 MG 24 hr capsule    amphetamine-dextroamphetamine (ADDERALL XR) 20 MG 24 hr capsule    Leukocytosis        Relevant Orders    POCT urinalysis dipstick (Completed)    Urine Culture       Follow-up: Return in about 6 months (around 08/05/2015), or if symptoms worsen or fail to improve, for add.  Loreen Freud, DO

## 2015-02-02 NOTE — Assessment & Plan Note (Signed)
Refill meds Recheck 6 months or sooner prn

## 2015-04-04 ENCOUNTER — Encounter (HOSPITAL_BASED_OUTPATIENT_CLINIC_OR_DEPARTMENT_OTHER): Payer: Self-pay | Admitting: Emergency Medicine

## 2015-04-04 ENCOUNTER — Emergency Department (HOSPITAL_BASED_OUTPATIENT_CLINIC_OR_DEPARTMENT_OTHER): Payer: BLUE CROSS/BLUE SHIELD

## 2015-04-04 ENCOUNTER — Emergency Department (HOSPITAL_BASED_OUTPATIENT_CLINIC_OR_DEPARTMENT_OTHER)
Admission: EM | Admit: 2015-04-04 | Discharge: 2015-04-05 | Disposition: A | Discharge status: Home / Self Care | Payer: BLUE CROSS/BLUE SHIELD | Attending: Emergency Medicine | Admitting: Emergency Medicine

## 2015-04-04 DIAGNOSIS — Z87891 Personal history of nicotine dependence: Secondary | ICD-10-CM | POA: Diagnosis not present

## 2015-04-04 DIAGNOSIS — F909 Attention-deficit hyperactivity disorder, unspecified type: Secondary | ICD-10-CM | POA: Diagnosis not present

## 2015-04-04 DIAGNOSIS — N12 Tubulo-interstitial nephritis, not specified as acute or chronic: Secondary | ICD-10-CM | POA: Diagnosis not present

## 2015-04-04 DIAGNOSIS — R109 Unspecified abdominal pain: Secondary | ICD-10-CM

## 2015-04-04 DIAGNOSIS — Z3202 Encounter for pregnancy test, result negative: Secondary | ICD-10-CM | POA: Diagnosis not present

## 2015-04-04 LAB — URINALYSIS, ROUTINE W REFLEX MICROSCOPIC
Bilirubin Urine: NEGATIVE
Glucose, UA: NEGATIVE mg/dL
Ketones, ur: NEGATIVE mg/dL
Nitrite: POSITIVE — AB
Protein, ur: 30 mg/dL — AB
Specific Gravity, Urine: 1.021 (ref 1.005–1.030)
Urobilinogen, UA: >8 mg/dL — ABNORMAL HIGH (ref 0.0–1.0)
pH: 7.5 (ref 5.0–8.0)

## 2015-04-04 LAB — URINE MICROSCOPIC-ADD ON

## 2015-04-04 LAB — PREGNANCY, URINE: Preg Test, Ur: NEGATIVE

## 2015-04-04 MED ORDER — METOCLOPRAMIDE HCL 10 MG PO TABS
10.0000 mg | ORAL_TABLET | Freq: Once | ORAL | Status: AC
  Administered 2015-04-05: 10 mg via ORAL
  Filled 2015-04-04: qty 1

## 2015-04-04 MED ORDER — OXYCODONE-ACETAMINOPHEN 5-325 MG PO TABS
1.0000 | ORAL_TABLET | Freq: Once | ORAL | Status: AC
  Administered 2015-04-05: 1 via ORAL
  Filled 2015-04-04: qty 1

## 2015-04-04 NOTE — ED Provider Notes (Signed)
CSN: [id number]     Arrival date & time 04/04/15  2203 History  By signing my name below, I, Terrance Branch, attest that this documentation has been prepared under the direction and in the presence of Paula Libra, MD. Electronically Signed: Evon Slack, ED Scribe. 04/05/2015. 12:47 AM.      Chief Complaint  Patient presents with  . Flank Pain    right    Patient is a 29 y.o. female presenting with flank pain. The history is provided by the patient. No language interpreter was used.  Flank Pain   HPI Comments: Margaret Horton is a 29 y.o. female who presents to the Emergency Department complaining of right sided flank pain onset 3 days prior. Pt rates the severity of her pain 8.5-9/10. Pt reports having intermittent chills, nausea and low back pain. Pt states that lying down and taking deep breaths makes the pain worse. Pt states she has tried Excedrin, ibuprofen and aleve with no relief. Pt denies dysuria, hematuria or vomiting.    Past Medical History  Diagnosis Date  . Attention deficit disorder (ADD)   . Anxiety    History reviewed. No pertinent past surgical history. Family History  Problem Relation Age of Onset  . Hypertension Father   . Kidney cancer Father   . Cancer Father 32    lyphoma  . Lymphoma    . Hypertension Paternal Grandfather   . Diabetes Paternal Grandfather    Social History  Substance Use Topics  . Smoking status: Former Smoker -- 0.10 packs/day for 4 years    Types: Cigarettes    Quit date: 01/30/2011  . Smokeless tobacco: Never Used  . Alcohol Use: 0.5 oz/week    1 drink(s) per week     Comment: very rare   OB History    No data available     Review of Systems  Genitourinary: Positive for flank pain.  All other systems reviewed and are negative.  A complete 10 system review of systems was obtained and all systems are negative except as noted in the HPI and PMH.     Allergies  Review of patient's allergies indicates no known  allergies.  Home Medications   Prior to Admission medications   Medication Sig Start Date End Date Taking? Authorizing Provider  amphetamine-dextroamphetamine (ADDERALL XR) 20 MG 24 hr capsule Take 1 capsule (20 mg total) by mouth every morning. 02/02/15   Lelon Perla, DO  amphetamine-dextroamphetamine (ADDERALL XR) 20 MG 24 hr capsule Take 1 capsule (20 mg total) by mouth every morning. 02/02/15   Lelon Perla, DO  amphetamine-dextroamphetamine (ADDERALL XR) 20 MG 24 hr capsule Take 1 capsule (20 mg total) by mouth every morning. 02/02/15   Lelon Perla, DO  Norgestimate-Ethinyl Estradiol Triphasic (TRI-SPRINTEC) 0.18/0.215/0.25 MG-35 MCG tablet TAKE ONE TABLET BY MOUTH EVERY DAY. OFFICE VISIT DUE NOW 10/20/14   Grayling Congress Lowne, DO   BP 120/74 mmHg  Pulse 116  Temp(Src) 99.3 F (37.4 C) (Oral)  Resp 18  Ht  (1.575 m)  Wt 130 lb (58.968 kg)  BMI 23.77 kg/m2  SpO2 97%   Physical Exam General: Well-developed, well-nourished female in no acute distress; appearance consistent with age of record HENT: normocephalic; atraumatic Eyes: pupils equal, round and reactive to light; extraocular muscles intact Neck: supple Heart: regular rate and rhythm; no murmurs, rubs or gallops Lungs: clear to auscultation bilaterally Abdomen: soft; nondistended; suprapubic tenderness; no masses or hepatosplenomegaly; bowel sounds present GU: No CVA tenderness  Extremities: No deformity; full range of motion; pulses normal Neurologic: Awake, alert and oriented; motor function intact in all extremities and symmetric; no facial droop Skin: Warm and dry Psychiatric: Normal mood and affect  ED Course  Procedures (including critical care time) DIAGNOSTIC STUDIES: Oxygen Saturation is 97% on RA, normal by my interpretation.    COORDINATION OF CARE: 11:49 PM-Discussed treatment plan with pt at bedside and pt agreed to plan.    MDM   Nursing notes and vitals signs, including pulse oximetry,  reviewed.  Summary of this visit's results, reviewed by myself:  Labs:  Results for orders placed or performed during the hospital encounter of 04/04/15 (from the past 24 hour(s))  Urinalysis, Routine w reflex microscopic (not at [facility])     Status: Abnormal   Collection Time: 04/04/15 10:07 PM  Result Value Ref Range   Color, Urine YELLOW YELLOW   APPearance CLOUDY (A) CLEAR   Specific Gravity, Urine 1.021 1.005 - 1.030   pH 7.5 5.0 - 8.0   Glucose, UA NEGATIVE NEGATIVE mg/dL   Hgb urine dipstick SMALL (A) NEGATIVE   Bilirubin Urine NEGATIVE NEGATIVE   Ketones, ur NEGATIVE NEGATIVE mg/dL   Protein, ur 30 (A) NEGATIVE mg/dL   Urobilinogen, UA >1.6 (H) 0.0 - 1.0 mg/dL   Nitrite POSITIVE (A) NEGATIVE   Leukocytes, UA MODERATE (A) NEGATIVE  Pregnancy, urine     Status: None   Collection Time: 04/04/15 10:07 PM  Result Value Ref Range   Preg Test, Ur NEGATIVE NEGATIVE  Urine microscopic-add on     Status: Abnormal   Collection Time: 04/04/15 10:07 PM  Result Value Ref Range   Squamous Epithelial / LPF MANY (A) RARE   WBC, UA 11-20 <3 WBC/hpf   RBC / HPF 3-6 <3 RBC/hpf   Bacteria, UA MANY (A) RARE   Urine-Other MUCOUS PRESENT     Imaging Studies: Ct Renal Stone Study  04/05/2015   CLINICAL DATA:  RIGHT flank pain for 3 days, intermittent chills, nausea. Mild back pain.  EXAM: CT ABDOMEN AND PELVIS WITHOUT CONTRAST  TECHNIQUE: Multidetector CT imaging of the abdomen and pelvis was performed following the standard protocol without IV contrast.  COMPARISON:  None.  FINDINGS: LUNG BASES: Included view of the lung bases are clear. The visualized heart and pericardium are unremarkable.  KIDNEYS/BLADDER: Kidneys are orthotopic, demonstrating normal size and morphology. Mild nonspecific RIGHT perinephric stranding. No nephrolithiasis, hydronephrosis; limited assessment for renal masses on this nonenhanced examination. The unopacified ureters are normal in course and caliber. Urinary bladder is  partially distended with mild disproportion circumferential wall thickening.  SOLID ORGANS: The imaged liver, spleen, gallbladder, pancreas and adrenal glands are unremarkable for this non-contrast examination.  GASTROINTESTINAL TRACT: The stomach, small and large bowel are normal in course and caliber without inflammatory changes, the sensitivity may be decreased by lack of enteric contrast. Moderate amount of retained large bowel stool. Normal appendix.  PERITONEUM/RETROPERITONEUM: Aortoiliac vessels are normal in course and caliber. No lymphadenopathy by CT size criteria. Internal reproductive organs are unremarkable. Small amount of free fluid in the pelvis is likely physiologic.  SOFT TISSUES/ OSSEOUS STRUCTURES:  Nonsuspicious.  IMPRESSION: Mild nonspecific RIGHT perinephric fat stranding, without urolithiasis or obstructive uropathy. Mild urinary bladder wall thickening can be seen with cystitis.  Normal appendix.   Electronically Signed   By: Awilda Metro M.D.   On: 04/05/2015 00:35   History and CT are consistent with mild pyelonephritis.  Final diagnoses:  Right flank pain  I personally performed the services described in this documentation, which was scribed in my presence. The recorded information has been reviewed and is accurate.      Paula Libra, MD 04/05/15 [phone]

## 2015-04-04 NOTE — ED Notes (Signed)
Pt states she started having right side pain on Thursday, thought she pulled something but pain has gotten worse

## 2015-04-05 MED ORDER — CIPROFLOXACIN HCL 500 MG PO TABS: 500.0000 mg | ORAL_TABLET | Freq: Two times a day (BID) | ORAL | Status: DC

## 2015-04-05 MED ORDER — METOCLOPRAMIDE HCL 10 MG PO TABS: 10.0000 mg | ORAL_TABLET | Freq: Four times a day (QID) | ORAL | Status: DC | PRN

## 2015-04-05 MED ORDER — CIPROFLOXACIN HCL 500 MG PO TABS
500.0000 mg | ORAL_TABLET | Freq: Once | ORAL | Status: AC
  Administered 2015-04-05: 500 mg via ORAL
  Filled 2015-04-05: qty 1

## 2015-04-05 MED ORDER — HYDROCODONE-ACETAMINOPHEN 5-325 MG PO TABS: 1.0000 | ORAL_TABLET | Freq: Four times a day (QID) | ORAL | Status: DC | PRN

## 2015-04-05 MED ORDER — FLUCONAZOLE 150 MG PO TABS: ORAL_TABLET | ORAL | Status: DC

## 2015-04-07 LAB — URINE CULTURE: Culture: >=100000

## 2015-04-08 ENCOUNTER — Telehealth (HOSPITAL_BASED_OUTPATIENT_CLINIC_OR_DEPARTMENT_OTHER): Payer: Self-pay | Admitting: Emergency Medicine

## 2015-04-08 NOTE — Telephone Encounter (Signed)
Post ED Visit - Positive Culture Follow-up  Culture report reviewed by antimicrobial stewardship pharmacist:   Celedonio Miyamoto, Pharm.D., BCPS  Georgina Pillion, Pharm.D., BCPS  [place], [place].D., BCPS, AAHIVP  Estella Husk, Pharm.D., BCPS, AAHIVP  [employer], [address].D.  Tennis Must, [place].D. Sandi Carne PharmD  Positive urine culture E. Coli Treated with ciprofloxacin and flucanazole, organism sensitive to the same and no further patient follow-up is required at this time.  Berle Mull 04/08/2015, 10:15 AM

## 2015-06-16 ENCOUNTER — Other Ambulatory Visit: Payer: Self-pay | Admitting: Family Medicine

## 2015-06-16 DIAGNOSIS — F988 Other specified behavioral and emotional disorders with onset usually occurring in childhood and adolescence: Secondary | ICD-10-CM

## 2015-06-16 NOTE — Telephone Encounter (Signed)
Pt called for refill on adderall. She called the pharmacy to "refill" and was told to contact us. I advised pt to call when she has 3-5 days on hand because she has to come in and pick up hard copy RX. Pt said she got 3 months of RX last time. Please call when RX is ready at [phone]. Pt is out but aware Dr. Laury AxonLowne is out until Thursday.

## 2015-06-17 MED ORDER — AMPHETAMINE-DEXTROAMPHET ER 20 MG PO CP24: 20.0000 mg | ORAL_CAPSULE | ORAL | Status: DC

## 2015-06-17 NOTE — Telephone Encounter (Signed)
Rx approved by Dr.Lowne. 1 mo given only, She needs UDS.  Patient aware Rx will be ready after 3 pm today.   KP

## 2015-06-17 NOTE — Telephone Encounter (Signed)
Last seen 02/02/15 and filled for 3 months.  UDS 8/2/16High Risk.  Neg for Adderall and + for Marijuana  Please advise        KP

## 2015-06-17 NOTE — Addendum Note (Signed)
Addended by: Arnette NorrisPAYNE, Daijanae Rafalski P on: 06/17/2015 11:45 AM   Modules accepted: Orders

## 2015-06-18 ENCOUNTER — Ambulatory Visit (INDEPENDENT_AMBULATORY_CARE_PROVIDER_SITE_OTHER): Payer: BLUE CROSS/BLUE SHIELD | Admitting: Family Medicine

## 2015-06-18 ENCOUNTER — Encounter: Payer: Self-pay | Admitting: Family Medicine

## 2015-06-18 VITALS — BP 122/68 | HR 105 | Temp 99.2°F | Wt 130.6 lb

## 2015-06-18 DIAGNOSIS — F329 Major depressive disorder, single episode, unspecified: Secondary | ICD-10-CM | POA: Diagnosis not present

## 2015-06-18 DIAGNOSIS — F32A Depression, unspecified: Secondary | ICD-10-CM | POA: Insufficient documentation

## 2015-06-18 MED ORDER — CITALOPRAM HYDROBROMIDE 20 MG PO TABS: 20.0000 mg | ORAL_TABLET | Freq: Every day | ORAL | Status: DC

## 2015-06-18 NOTE — Assessment & Plan Note (Addendum)
celexa 20 mg 1/2 tab for 1 week then 1 po qd rto 1 month or sooner prn Pt to see psych for testing and will wait for further advise---- recommend counseling as well

## 2015-06-18 NOTE — Patient Instructions (Signed)
Major Depressive Disorder Major depressive disorder is a mental illness. It also may be called clinical depression or unipolar depression. Major depressive disorder usually causes feelings of sadness, hopelessness, or helplessness. Some people with this disorder do not feel particularly sad but lose interest in doing things they used to enjoy (anhedonia). Major depressive disorder also can cause physical symptoms. It can interfere with work, school, relationships, and other normal everyday activities. The disorder varies in severity but is longer lasting and more serious than the sadness we all feel from time to time in our lives. Major depressive disorder often is triggered by stressful life events or major life changes. Examples of these triggers include divorce, loss of your job or home, a move, and the death of a family member or close friend. Sometimes this disorder occurs for no obvious reason at all. People who have family members with major depressive disorder or bipolar disorder are at higher risk for developing this disorder, with or without life stressors. Major depressive disorder can occur at any age. It may occur just once in your life (single episode major depressive disorder). It may occur multiple times (recurrent major depressive disorder). SYMPTOMS People with major depressive disorder have either anhedonia or depressed mood on nearly a daily basis for at least 2 weeks or longer. Symptoms of depressed mood include:  Feelings of sadness (blue or down in the dumps) or emptiness.  Feelings of hopelessness or helplessness.  Tearfulness or episodes of crying (may be observed by others).  Irritability (children and adolescents). In addition to depressed mood or anhedonia or both, people with this disorder have at least four of the following symptoms:  Difficulty sleeping or sleeping too much.   Significant change (increase or decrease) in appetite or weight.   Lack of energy or  motivation.  Feelings of guilt and worthlessness.   Difficulty concentrating, remembering, or making decisions.  Unusually slow movement (psychomotor retardation) or restlessness (as observed by others).   Recurrent wishes for death, recurrent thoughts of self-harm (suicide), or a suicide attempt. People with major depressive disorder commonly have persistent negative thoughts about themselves, other people, and the world. People with severe major depressive disorder may experiencedistorted beliefs or perceptions about the world (psychotic delusions). They also may see or hear things that are not real (psychotic hallucinations). DIAGNOSIS Major depressive disorder is diagnosed through an assessment by your health care provider. Your health care provider will ask aboutaspects of your daily life, such as mood,sleep, and appetite, to see if you have the diagnostic symptoms of major depressive disorder. Your health care provider may ask about your medical history and use of alcohol or drugs, including prescription medicines. Your health care provider also may do a physical exam and blood work. This is because certain medical conditions and the use of certain substances can cause major depressive disorder-like symptoms (secondary depression). Your health care provider also may refer you to a mental health specialist for further evaluation and treatment. TREATMENT It is important to recognize the symptoms of major depressive disorder and seek treatment. The following treatments can be prescribed for this disorder:   Medicine. Antidepressant medicines usually are prescribed. Antidepressant medicines are thought to correct chemical imbalances in the brain that are commonly associated with major depressive disorder. Other types of medicine may be added if the symptoms do not respond to antidepressant medicines alone or if psychotic delusions or hallucinations occur.  Talk therapy. Talk therapy can be  helpful in treating major depressive disorder by providing   support, education, and guidance. Certain types of talk therapy also can help with negative thinking (cognitive behavioral therapy) and with relationship issues that trigger this disorder (interpersonal therapy). A mental health specialist can help determine which treatment is best for you. Most people with major depressive disorder do well with a combination of medicine and talk therapy. Treatments involving electrical stimulation of the brain can be used in situations with extremely severe symptoms or when medicine and talk therapy do not work over time. These treatments include electroconvulsive therapy, transcranial magnetic stimulation, and vagal nerve stimulation.   This information is not intended to replace advice given to you by your health care provider. Make sure you discuss any questions you have with your health care provider.   Document Released: 10/14/2012 Document Revised: 07/10/2014 Document Reviewed: 10/14/2012 Elsevier Interactive Patient Education 2016 Else[employer] Inc.  
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
## 2015-06-18 NOTE — Progress Notes (Signed)
Pre visit review using our clinic review tool, if applicable. No additional management support is needed unless otherwise documented below in the visit note. 

## 2015-07-14 ENCOUNTER — Ambulatory Visit (INDEPENDENT_AMBULATORY_CARE_PROVIDER_SITE_OTHER): Payer: BLUE CROSS/BLUE SHIELD | Admitting: Psychology

## 2015-07-14 DIAGNOSIS — F330 Major depressive disorder, recurrent, mild: Secondary | ICD-10-CM

## 2015-07-14 DIAGNOSIS — F8082 Social pragmatic communication disorder: Secondary | ICD-10-CM

## 2015-07-14 DIAGNOSIS — F900 Attention-deficit hyperactivity disorder, predominantly inattentive type: Secondary | ICD-10-CM | POA: Diagnosis not present

## 2015-07-30 ENCOUNTER — Ambulatory Visit (INDEPENDENT_AMBULATORY_CARE_PROVIDER_SITE_OTHER): Payer: BLUE CROSS/BLUE SHIELD | Admitting: Psychology

## 2015-07-30 DIAGNOSIS — F330 Major depressive disorder, recurrent, mild: Secondary | ICD-10-CM | POA: Diagnosis not present

## 2015-07-30 DIAGNOSIS — F8082 Social pragmatic communication disorder: Secondary | ICD-10-CM | POA: Diagnosis not present

## 2015-07-30 DIAGNOSIS — F900 Attention-deficit hyperactivity disorder, predominantly inattentive type: Secondary | ICD-10-CM | POA: Diagnosis not present

## 2015-08-05 ENCOUNTER — Ambulatory Visit: Payer: BLUE CROSS/BLUE SHIELD | Admitting: Family Medicine

## 2015-08-05 ENCOUNTER — Telehealth: Payer: Self-pay | Admitting: Family Medicine

## 2015-08-06 ENCOUNTER — Encounter: Payer: Self-pay | Admitting: Family Medicine

## 2015-08-06 NOTE — Telephone Encounter (Signed)
Pt was no show 08/05/15 8:15am for follow up appt, pt has not rescheduled, ph # disconnected, charge or no charge?

## 2015-08-06 NOTE — Telephone Encounter (Signed)
Marked to charge and mailing no show letter °

## 2015-08-06 NOTE — Telephone Encounter (Signed)
charge 

## 2015-08-10 ENCOUNTER — Telehealth: Payer: Self-pay | Admitting: Family Medicine

## 2015-08-10 DIAGNOSIS — F988 Other specified behavioral and emotional disorders with onset usually occurring in childhood and adolescence: Secondary | ICD-10-CM

## 2015-08-10 NOTE — Telephone Encounter (Signed)
Caller name: Viktorya   Relationship to patient: self   Can be reached: [phone]  Pharmacy:  Reason for call: pt is requesting a refill on her Adderall Rx. Pt says that she will send her mom to pick up when ready.

## 2015-08-10 NOTE — Telephone Encounter (Signed)
Please call pt at [phone] which is mom's cell phone.

## 2015-08-10 NOTE — Telephone Encounter (Signed)
Ok to fill 
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
## 2015-08-11 MED ORDER — AMPHETAMINE-DEXTROAMPHET ER 20 MG PO CP24: 20.0000 mg | ORAL_CAPSULE | ORAL | Status: DC

## 2015-08-11 NOTE — Telephone Encounter (Signed)
Mother has been made aware the Rx is ready for pick up tomorrow.        KP

## 2015-08-13 ENCOUNTER — Ambulatory Visit: Payer: BLUE CROSS/BLUE SHIELD | Admitting: Psychology

## 2015-08-13 NOTE — Telephone Encounter (Signed)
Ebony - please do not charge for 08/05/15

## 2015-08-13 NOTE — Telephone Encounter (Signed)
Pt received no show letter. She states that she did not know she had appt scheduled. She apologized for missing appt. She doesn't have phone right now so did not get a reminder. She gave me her mom's phone # to put in her chart until she gets another phone. Pt is requesting fee be waived this time for her. Please advise.

## 2015-08-13 NOTE — Telephone Encounter (Signed)
No charge. 

## 2015-08-17 ENCOUNTER — Ambulatory Visit: Payer: BLUE CROSS/BLUE SHIELD | Admitting: Family Medicine

## 2015-08-20 ENCOUNTER — Encounter: Payer: Self-pay | Admitting: Family Medicine

## 2015-08-20 ENCOUNTER — Ambulatory Visit: Payer: BLUE CROSS/BLUE SHIELD | Admitting: Family Medicine

## 2015-08-20 ENCOUNTER — Ambulatory Visit (INDEPENDENT_AMBULATORY_CARE_PROVIDER_SITE_OTHER): Payer: BLUE CROSS/BLUE SHIELD | Admitting: Family Medicine

## 2015-08-20 VITALS — BP 118/76 | HR 70 | Temp 99.7°F | Ht 62.0 in | Wt 126.4 lb

## 2015-08-20 DIAGNOSIS — F909 Attention-deficit hyperactivity disorder, unspecified type: Secondary | ICD-10-CM | POA: Diagnosis not present

## 2015-08-20 DIAGNOSIS — Z3041 Encounter for surveillance of contraceptive pills: Secondary | ICD-10-CM

## 2015-08-20 DIAGNOSIS — F329 Major depressive disorder, single episode, unspecified: Secondary | ICD-10-CM | POA: Diagnosis not present

## 2015-08-20 DIAGNOSIS — F988 Other specified behavioral and emotional disorders with onset usually occurring in childhood and adolescence: Secondary | ICD-10-CM

## 2015-08-20 DIAGNOSIS — F32A Depression, unspecified: Secondary | ICD-10-CM

## 2015-08-20 MED ORDER — NORGESTIM-ETH ESTRAD TRIPHASIC 0.18/0.215/0.25 MG-35 MCG PO TABS: ORAL_TABLET | ORAL | Status: DC

## 2015-08-20 MED ORDER — CITALOPRAM HYDROBROMIDE 20 MG PO TABS: 20.0000 mg | ORAL_TABLET | Freq: Every day | ORAL | Status: DC

## 2015-08-20 NOTE — Progress Notes (Signed)
Pre visit review using our clinic review tool, if applicable. No additional management support is needed unless otherwise documented below in the visit note. 

## 2015-08-20 NOTE — Patient Instructions (Signed)
Attention Deficit Hyperactivity Disorder  Attention deficit hyperactivity disorder (ADHD) is a problem with behavior issues based on the way the brain functions (neurobehavioral disorder). It is a common reason for behavior and academic problems in school.  SYMPTOMS   There are 3 types of ADHD. The 3 types and some of the symptoms include:  · Inattentive.    Gets bored or distracted easily.    Loses or forgets things. Forgets to hand in homework.    Has trouble organizing or completing tasks.    Difficulty staying on task.    An inability to organize daily tasks and school work.    Leaving projects, chores, or homework unfinished.    Trouble paying attention or responding to details. Careless mistakes.    Difficulty following directions. Often seems like is not listening.    Dislikes activities that require sustained attention (like chores or homework).  · Hyperactive-impulsive.    Feels like it is impossible to sit still or stay in a seat. Fidgeting with hands and feet.    Trouble waiting turn.    Talking too much or out of turn. Interruptive.    Speaks or acts impulsively.    Aggressive, disruptive behavior.    Constantly busy or on the go; noisy.    Often leaves seat when they are expected to remain seated.    Often runs or climbs where it is not appropriate, or feels very restless.  · Combined.    Has symptoms of both of the above.  Often children with ADHD feel discouraged about themselves and with school. They often perform well below their abilities in school.  As children get older, the excess motor activities can calm down, but the problems with paying attention and staying organized persist. Most children do not outgrow ADHD but with good treatment can learn to cope with the symptoms.  DIAGNOSIS   When ADHD is suspected, the diagnosis should be made by professionals trained in ADHD. This professional will collect information about the individual suspected of having ADHD. Information must be collected from  various settings where the person lives, works, or attends school.    Diagnosis will include:  · Confirming symptoms began in childhood.  · Ruling out other reasons for the child's behavior.  · The health care providers will check with the child's school and check their medical records.  · They will talk to teachers and parents.  · Behavior rating scales for the child will be filled out by those dealing with the child on a daily basis.  A diagnosis is made only after all information has been considered.  TREATMENT   Treatment usually includes behavioral treatment, tutoring or extra support in school, and stimulant medicines. Because of the way a person's brain works with ADHD, these medicines decrease impulsivity and hyperactivity and increase attention. This is different than how they would work in a person who does not have ADHD. Other medicines used include antidepressants and certain blood pressure medicines.  Most experts agree that treatment for ADHD should address all aspects of the person's functioning. Along with medicines, treatment should include structured classroom management at school. Parents should reward good behavior, provide constant discipline, and set limits. Tutoring should be available for the child as needed.  ADHD is a lifelong condition. If untreated, the disorder can have long-term serious effects into adolescence and adulthood.  HOME CARE INSTRUCTIONS   · Often with ADHD there is a lot of frustration among family members dealing with the condition. Blame   and anger are also feelings that are common. In many cases, because the problem affects the family as a whole, the entire family may need help. A therapist can help the family find better ways to handle the disruptive behaviors of the person with ADHD and promote change. If the person with ADHD is young, most of the therapist's work is with the parents. Parents will learn techniques for coping with and improving their child's behavior.  Sometimes only the child with the ADHD needs counseling. Your health care providers can help you make these decisions.  · Children with ADHD may need help learning how to organize. Some helpful tips include:  ¨ Keep routines the same every day from wake-up time to bedtime. Schedule all activities, including homework and playtime. Keep the schedule in a place where the person with ADHD will often see it. Mark schedule changes as far in advance as possible.  ¨ Schedule outdoor and indoor recreation.  ¨ Have a place for everything and keep everything in its place. This includes clothing, backpacks, and school supplies.  ¨ Encourage writing down assignments and bringing home needed books. Work with your child's teachers for assistance in organizing school work.  · Offer your child a well-balanced diet. Breakfast that includes a balance of whole grains, protein, and fruits or vegetables is especially important for school performance. Children should avoid drinks with caffeine including:  ¨ Soft drinks.  ¨ Coffee.  ¨ Tea.  ¨ However, some older children (adolescents) may find these drinks helpful in improving their attention. Because it can also be common for adolescents with ADHD to become addicted to caffeine, talk with your health care provider about what is a safe amount of caffeine intake for your child.  · Children with ADHD need consistent rules that they can understand and follow. If rules are followed, give small rewards. Children with ADHD often receive, and expect, criticism. Look for good behavior and praise it. Set realistic goals. Give clear instructions. Look for activities that can foster success and self-esteem. Make time for pleasant activities with your child. Give lots of affection.  · Parents are their children's greatest advocates. Learn as much as possible about ADHD. This helps you become a stronger and better advocate for your child. It also helps you educate your child's teachers and instructors  if they feel inadequate in these areas. Parent support groups are often helpful. A national group with local chapters is called Children and Adults with Attention Deficit Hyperactivity Disorder (CHADD).  SEEK MEDICAL CARE IF:  · Your child has repeated muscle twitches, cough, or speech outbursts.  · Your child has sleep problems.  · Your child has a marked loss of appetite.  · Your child develops depression.  · Your child has new or worsening behavioral problems.  · Your child develops dizziness.  · Your child has a racing heart.  · Your child has stomach pains.  · Your child develops headaches.  SEEK IMMEDIATE MEDICAL CARE IF:  · Your child has been diagnosed with depression or anxiety and the symptoms seem to be getting worse.  · Your child has been depressed and suddenly appears to have increased energy or motivation.  · You are worried that your child is having a bad reaction to a medication he or she is taking for ADHD.     This information is not intended to replace advice given to you by your health care provider. Make sure you discuss any questions you have with your   health care provider.     Document Released: 06/09/2002 Document Revised: 06/24/2013 Document Reviewed: 02/24/2013  Elsevier Interactive Patient Education ©2016 [employer]

## 2015-08-20 NOTE — Progress Notes (Signed)
Patient ID: Margaret Horton, female    DOB: 1986-02-24  Age: 30 y.o. MRN: [id number]    Subjective:  Subjective HPI Margaret Horton presents for f/u depression, add.  Her mother is also present.  She will be going to court on Monday to testify against her ex boyfriend.  Pt is now living with her parents.    Review of Systems  Constitutional: Negative for diaphoresis, appetite change, fatigue and unexpected weight change.  Eyes: Negative for pain, redness and visual disturbance.  Respiratory: Negative for cough, chest tightness, shortness of breath and wheezing.   Cardiovascular: Negative for chest pain, palpitations and leg swelling.  Endocrine: Negative for cold intolerance, heat intolerance, polydipsia, polyphagia and polyuria.  Genitourinary: Negative for dysuria, frequency and difficulty urinating.  Neurological: Negative for dizziness, light-headedness, numbness and headaches.    History Past Medical History  Diagnosis Date  . Attention deficit disorder (ADD)   . Anxiety     She has no past surgical history on file.   Her family history includes Cancer (age of onset: 69) in her father; Diabetes in her paternal grandfather; Hypertension in her father and paternal grandfather; Kidney cancer in her father.She reports that she quit smoking about 4 years ago. Her smoking use included Cigarettes. She has a .4 pack-year smoking history. She has never used smokeless tobacco. She reports that she drinks about 0.5 oz of alcohol per week. She reports that she does not use illicit drugs.  Current Outpatient Prescriptions on File Prior to Visit  Medication Sig Dispense Refill  . amphetamine-dextroamphetamine (ADDERALL XR) 20 MG 24 hr capsule Take 1 capsule (20 mg total) by mouth every morning. 30 capsule 0  . metoCLOPramide (REGLAN) 10 MG tablet Take 1 tablet (10 mg total) by mouth every 6 (six) hours as needed for nausea or vomiting. 10 tablet 0   No current facility-administered medications on  file prior to visit.     Objective:  Objective Physical Exam  Constitutional: She is oriented to person, place, and time. She appears well-developed and well-nourished.  HENT:  Head: Normocephalic and atraumatic.  Eyes: Conjunctivae and EOM are normal.  Neck: Normal range of motion. Neck supple. No JVD present. Carotid bruit is not present. No thyromegaly present.  Cardiovascular: Normal rate, regular rhythm and normal heart sounds.   No murmur heard. Pulmonary/Chest: Effort normal and breath sounds normal. No respiratory distress. She has no wheezes. She has no rales. She exhibits no tenderness.  Musculoskeletal: She exhibits no edema.  Neurological: She is alert and oriented to person, place, and time.  Psychiatric: She has a normal mood and affect. Her behavior is normal. Judgment and thought content normal.  Nursing note and vitals reviewed.  BP 118/76 mmHg  Pulse 70  Temp(Src) 99.7 F (37.6 C) (Oral)  Ht  (1.575 m)  Wt 126 lb 6.4 oz (57.335 kg)  BMI 23.11 kg/m2  SpO2 99% Wt Readings from Last 3 Encounters:  08/20/15 126 lb 6.4 oz (57.335 kg)  06/18/15 130 lb 9.6 oz (59.24 kg)  04/04/15 130 lb (58.968 kg)     Lab Results  Component Value Date   WBC 6.2 08/12/2013   HGB 14.3 08/12/2013   HCT 43.1 08/12/2013   PLT 281.0 08/12/2013   GLUCOSE 98 08/12/2013   CHOL 233* 08/12/2013   TRIG 62.0 08/12/2013   HDL 66.00 08/12/2013   LDLDIRECT 163.5 08/12/2013   LDLCALC 123* 12/15/2010   ALT 15 08/12/2013   AST 22 08/12/2013   NA 138  08/12/2013   K 4.0 08/12/2013   CL 103 08/12/2013   CREATININE 0.7 08/12/2013   BUN 7 08/12/2013   CO2 27 08/12/2013   TSH 1.38 08/12/2013    Ct Renal Stone Study  04/05/2015  CLINICAL DATA:  RIGHT flank pain for 3 days, intermittent chills, nausea. Mild back pain. EXAM: CT ABDOMEN AND PELVIS WITHOUT CONTRAST TECHNIQUE: Multidetector CT imaging of the abdomen and pelvis was performed following the standard protocol without IV  contrast. COMPARISON:  None. FINDINGS: LUNG BASES: Included view of the lung bases are clear. The visualized heart and pericardium are unremarkable. KIDNEYS/BLADDER: Kidneys are orthotopic, demonstrating normal size and morphology. Mild nonspecific RIGHT perinephric stranding. No nephrolithiasis, hydronephrosis; limited assessment for renal masses on this nonenhanced examination. The unopacified ureters are normal in course and caliber. Urinary bladder is partially distended with mild disproportion circumferential wall thickening. SOLID ORGANS: The imaged liver, spleen, gallbladder, pancreas and adrenal glands are unremarkable for this non-contrast examination. GASTROINTESTINAL TRACT: The stomach, small and large bowel are normal in course and caliber without inflammatory changes, the sensitivity may be decreased by lack of enteric contrast. Moderate amount of retained large bowel stool. Normal appendix. PERITONEUM/RETROPERITONEUM: Aortoiliac vessels are normal in course and caliber. No lymphadenopathy by CT size criteria. Internal reproductive organs are unremarkable. Small amount of free fluid in the pelvis is likely physiologic. SOFT TISSUES/ OSSEOUS STRUCTURES:  Nonsuspicious. IMPRESSION: Mild nonspecific RIGHT perinephric fat stranding, without urolithiasis or obstructive uropathy. Mild urinary bladder wall thickening can be seen with cystitis. Normal appendix. Electronically Signed   By: Awilda Metro M.D.   On: 04/05/2015 00:35     Assessment & Plan:  Plan I have discontinued Margaret Horton's ciprofloxacin, fluconazole, and HYDROcodone-acetaminophen. I have also changed her Norgestimate-Ethinyl Estradiol Triphasic. Additionally, I am having her maintain her metoCLOPramide, amphetamine-dextroamphetamine, and citalopram.  Meds ordered this encounter  Medications  . Norgestimate-Ethinyl Estradiol Triphasic (TRI-SPRINTEC) 0.18/0.215/0.25 MG-35 MCG tablet    Sig: TAKE ONE TABLET BY MOUTH EVERY DAY     Dispense:  3 Package    Refill:  1  . citalopram (CELEXA) 20 MG tablet    Sig: Take 1 tablet (20 mg total) by mouth daily.    Dispense:  90 tablet    Refill:  3    Problem List Items Addressed This Visit      Unprioritized   Depression - Primary    Doing much better con't counseling con't meds      Relevant Medications   citalopram (CELEXA) 20 MG tablet   Attention deficit disorder    con't meds rto 6 months       Other Visit Diagnoses    Encounter for surveillance of contraceptive pills        Relevant Medications    Norgestimate-Ethinyl Estradiol Triphasic (TRI-SPRINTEC) 0.18/0.215/0.25 MG-35 MCG tablet       Follow-up: Return in about 6 months (around 02/17/2016), or if symptoms worsen or fail to improve, for add, depression.  Loreen Freud, DO

## 2015-08-22 NOTE — Assessment & Plan Note (Signed)
con't meds rto 6 months

## 2015-08-22 NOTE — Assessment & Plan Note (Signed)
Doing much better con't counseling con't meds

## 2015-08-27 ENCOUNTER — Ambulatory Visit (INDEPENDENT_AMBULATORY_CARE_PROVIDER_SITE_OTHER): Payer: BLUE CROSS/BLUE SHIELD | Admitting: Psychology

## 2015-08-27 DIAGNOSIS — F330 Major depressive disorder, recurrent, mild: Secondary | ICD-10-CM | POA: Diagnosis not present

## 2015-08-27 DIAGNOSIS — F8082 Social pragmatic communication disorder: Secondary | ICD-10-CM

## 2015-08-27 DIAGNOSIS — F900 Attention-deficit hyperactivity disorder, predominantly inattentive type: Secondary | ICD-10-CM

## 2015-09-03 ENCOUNTER — Other Ambulatory Visit (HOSPITAL_COMMUNITY)
Admission: RE | Admit: 2015-09-03 | Discharge: 2015-09-03 | Disposition: A | Discharge status: Home / Self Care | Payer: BLUE CROSS/BLUE SHIELD | Source: Ambulatory Visit | Attending: Family Medicine | Admitting: Family Medicine

## 2015-09-03 ENCOUNTER — Encounter: Payer: Self-pay | Admitting: Family Medicine

## 2015-09-03 ENCOUNTER — Ambulatory Visit (INDEPENDENT_AMBULATORY_CARE_PROVIDER_SITE_OTHER): Payer: BLUE CROSS/BLUE SHIELD | Admitting: Family Medicine

## 2015-09-03 VITALS — BP 108/58 | HR 68 | Temp 98.2°F | Ht 62.0 in | Wt 129.0 lb

## 2015-09-03 DIAGNOSIS — N898 Other specified noninflammatory disorders of vagina: Secondary | ICD-10-CM

## 2015-09-03 DIAGNOSIS — N760 Acute vaginitis: Secondary | ICD-10-CM | POA: Diagnosis present

## 2015-09-03 DIAGNOSIS — R350 Frequency of micturition: Secondary | ICD-10-CM | POA: Diagnosis not present

## 2015-09-03 DIAGNOSIS — R82998 Other abnormal findings in urine: Secondary | ICD-10-CM

## 2015-09-03 DIAGNOSIS — N390 Urinary tract infection, site not specified: Secondary | ICD-10-CM | POA: Diagnosis not present

## 2015-09-03 LAB — POC URINALSYSI DIPSTICK (AUTOMATED)
Bilirubin, UA: NEGATIVE
Blood, UA: NEGATIVE
Glucose, UA: NEGATIVE
Ketones, UA: NEGATIVE
Nitrite, UA: NEGATIVE
Protein, UA: NEGATIVE
Spec Grav, UA: 1.02
Urobilinogen, UA: 2
pH, UA: 7.5

## 2015-09-03 MED ORDER — CIPROFLOXACIN HCL 250 MG PO TABS: 250.0000 mg | ORAL_TABLET | Freq: Two times a day (BID) | ORAL | Status: DC

## 2015-09-03 NOTE — Patient Instructions (Signed)

## 2015-09-03 NOTE — Progress Notes (Signed)
Patient ID: Margaret Horton, female    DOB: [birth year]  Age: 30 y.o. MRN: [id number]    Subjective:  Subjective HPI Margaret GallusRachel Horton presents for urinary frequency and vaginal d/c x few days.   No other symptoms.  e  Review of Systems  Constitutional: Negative for diaphoresis, appetite change, fatigue and unexpected weight change.  Eyes: Negative for pain, redness and visual disturbance.  Respiratory: Negative for cough, chest tightness, shortness of breath and wheezing.   Cardiovascular: Negative for chest pain, palpitations and leg swelling.  Endocrine: Negative for cold intolerance, heat intolerance, polydipsia, polyphagia and polyuria.  Genitourinary: Positive for frequency and vaginal discharge. Negative for dysuria, vaginal bleeding, difficulty urinating and vaginal pain.  Neurological: Negative for dizziness, light-headedness, numbness and headaches.    History Past Medical History  Diagnosis Date  . Attention deficit disorder (ADD)   . Anxiety     She has no past surgical history on file.   Her family history includes Cancer (age of onset: 6448) in her father; Diabetes in her paternal grandfather; Hypertension in her father and paternal grandfather; Kidney cancer in her father.She reports that she quit smoking about 4 years ago. Her smoking use included Cigarettes. She has a .4 pack-year smoking history. She has never used smokeless tobacco. She reports that she drinks about 0.5 oz of alcohol per week. She reports that she does not use illicit drugs.  Current Outpatient Prescriptions on File Prior to Visit  Medication Sig Dispense Refill  . amphetamine-dextroamphetamine (ADDERALL XR) 20 MG 24 hr capsule Take 1 capsule (20 mg total) by mouth every morning. 30 capsule 0  . citalopram (CELEXA) 20 MG tablet Take 1 tablet (20 mg total) by mouth daily. 90 tablet 3  . metoCLOPramide (REGLAN) 10 MG tablet Take 1 tablet (10 mg total) by mouth every 6 (six) hours as needed for nausea or vomiting.  10 tablet 0  . Norgestimate-Ethinyl Estradiol Triphasic (TRI-SPRINTEC) 0.18/0.215/0.25 MG-35 MCG tablet TAKE ONE TABLET BY MOUTH EVERY DAY 3 Package 1   No current facility-administered medications on file prior to visit.     Objective:  Objective Physical Exam  Constitutional: She is oriented to person, place, and time. She appears well-developed and well-nourished.  HENT:  Head: Normocephalic and atraumatic.  Eyes: Conjunctivae and EOM are normal.  Neck: Normal range of motion. Neck supple. No JVD present. Carotid bruit is not present. No thyromegaly present.  Cardiovascular: Normal rate, regular rhythm and normal heart sounds.   No murmur heard. Pulmonary/Chest: Effort normal and breath sounds normal. No respiratory distress. She has no wheezes. She has no rales. She exhibits no tenderness.  Genitourinary: Uterus normal. Pelvic exam was performed with patient supine. No labial fusion. There is no rash, tenderness, lesion or injury on the right labia. There is no rash, tenderness, lesion or injury on the left labia. Cervix exhibits discharge. Cervix exhibits no motion tenderness and no friability. No erythema, tenderness or bleeding in the vagina. No foreign body around the vagina. No signs of injury around the vagina. Vaginal discharge found.  Musculoskeletal: She exhibits no edema.  Neurological: She is alert and oriented to person, place, and time.  Psychiatric: She has a normal mood and affect. Her behavior is normal. Judgment and thought content normal.  Nursing note and vitals reviewed.  BP 108/58 mmHg  Pulse 68  Temp(Src) 98.2 F (36.8 C) (Oral)  Ht 5\' 2"  (1.575 m)  Wt 129 lb (58.514 kg)  BMI 23.59 kg/m2  SpO2 98% Wt Readings  from Last 3 Encounters:  09/03/15 129 lb (58.514 kg)  08/20/15 126 lb 6.4 oz (57.335 kg)  06/18/15 130 lb 9.6 oz (59.24 kg)     Lab Results  Component Value Date   WBC 6.2 08/12/2013   HGB 14.3 08/12/2013   HCT 43.1 08/12/2013   PLT 281.0  08/12/2013   GLUCOSE 98 08/12/2013   CHOL 233* 08/12/2013   TRIG 62.0 08/12/2013   HDL 66.00 08/12/2013   LDLDIRECT 163.5 08/12/2013   LDLCALC 123* 12/15/2010   ALT 15 08/12/2013   AST 22 08/12/2013   NA 138 08/12/2013   K 4.0 08/12/2013   CL 103 08/12/2013   CREATININE 0.7 08/12/2013   BUN 7 08/12/2013   CO2 27 08/12/2013   TSH 1.38 08/12/2013    Ct Renal Stone Study  04/05/2015  CLINICAL DATA:  RIGHT flank pain for 3 days, intermittent chills, nausea. Mild back pain. EXAM: CT ABDOMEN AND PELVIS WITHOUT CONTRAST TECHNIQUE: Multidetector CT imaging of the abdomen and pelvis was performed following the standard protocol without IV contrast. COMPARISON:  None. FINDINGS: LUNG BASES: Included view of the lung bases are clear. The visualized heart and pericardium are unremarkable. KIDNEYS/BLADDER: Kidneys are orthotopic, demonstrating normal size and morphology. Mild nonspecific RIGHT perinephric stranding. No nephrolithiasis, hydronephrosis; limited assessment for renal masses on this nonenhanced examination. The unopacified ureters are normal in course and caliber. Urinary bladder is partially distended with mild disproportion circumferential wall thickening. SOLID ORGANS: The imaged liver, spleen, gallbladder, pancreas and adrenal glands are unremarkable for this non-contrast examination. GASTROINTESTINAL TRACT: The stomach, small and large bowel are normal in course and caliber without inflammatory changes, the sensitivity may be decreased by lack of enteric contrast. Moderate amount of retained large bowel stool. Normal appendix. PERITONEUM/RETROPERITONEUM: Aortoiliac vessels are normal in course and caliber. No lymphadenopathy by CT size criteria. Internal reproductive organs are unremarkable. Small amount of free fluid in the pelvis is likely physiologic. SOFT TISSUES/ OSSEOUS STRUCTURES:  Nonsuspicious. IMPRESSION: Mild nonspecific RIGHT perinephric fat stranding, without urolithiasis or  obstructive uropathy. Mild urinary bladder wall thickening can be seen with cystitis. Normal appendix. Electronically Signed   By: Awilda Metro M.D.   On: 04/05/2015 00:35     Assessment & Plan:  Plan I am having Ms. Petillo start on ciprofloxacin. I am also having her maintain her metoCLOPramide, amphetamine-dextroamphetamine, Norgestimate-Ethinyl Estradiol Triphasic, and citalopram.  Meds ordered this encounter  Medications  . ciprofloxacin (CIPRO) 250 MG tablet    Sig: Take 1 tablet (250 mg total) by mouth 2 (two) times daily.    Dispense:  6 tablet    Refill:  0    Problem List Items Addressed This Visit    None    Visit Diagnoses    Urine frequency    -  Primary    Relevant Medications    ciprofloxacin (CIPRO) 250 MG tablet    Other Relevant Orders    POCT Urinalysis Dipstick (Automated) (Completed)    Leukocytes in urine        Relevant Orders    POCT Urinalysis Dipstick (Automated) (Completed)    Urine Culture (Completed)    Vaginal discharge        Relevant Orders    Cervicovaginal ancillary only (Completed)       Follow-up: Return if symptoms worsen or fail to improve.  Loreen Freud, DO

## 2015-09-03 NOTE — Progress Notes (Deleted)
   Subjective:    Patient ID: Margaret Horton, female    DOB: [birth year], 30 y.o.   MRN: [id number]  HPI    Review of Systems     Objective:   Physical Exam        Assessment & Plan:

## 2015-09-03 NOTE — Progress Notes (Signed)
Pre visit review using our clinic review tool, if applicable. No additional management support is needed unless otherwise documented below in the visit note. 

## 2015-09-04 LAB — URINE CULTURE
Colony Count: NO GROWTH
Organism ID, Bacteria: NO GROWTH

## 2015-09-06 LAB — CERVICOVAGINAL ANCILLARY ONLY: Wet Prep (BD Affirm): POSITIVE — AB

## 2015-09-09 ENCOUNTER — Other Ambulatory Visit: Payer: Self-pay

## 2015-09-09 MED ORDER — METRONIDAZOLE 500 MG PO TABS: 500.0000 mg | ORAL_TABLET | Freq: Two times a day (BID) | ORAL | Status: DC

## 2015-09-09 MED ORDER — FLUCONAZOLE 150 MG PO TABS: 150.0000 mg | ORAL_TABLET | Freq: Every day | ORAL | Status: DC

## 2015-09-22 ENCOUNTER — Ambulatory Visit (INDEPENDENT_AMBULATORY_CARE_PROVIDER_SITE_OTHER): Payer: BLUE CROSS/BLUE SHIELD | Admitting: Psychology

## 2015-09-22 DIAGNOSIS — F330 Major depressive disorder, recurrent, mild: Secondary | ICD-10-CM

## 2015-09-22 DIAGNOSIS — F8082 Social pragmatic communication disorder: Secondary | ICD-10-CM

## 2015-09-22 DIAGNOSIS — F900 Attention-deficit hyperactivity disorder, predominantly inattentive type: Secondary | ICD-10-CM | POA: Diagnosis not present

## 2015-10-04 ENCOUNTER — Encounter: Payer: Self-pay | Admitting: Family Medicine

## 2015-10-06 ENCOUNTER — Telehealth: Payer: Self-pay | Admitting: Family Medicine

## 2015-10-06 DIAGNOSIS — F988 Other specified behavioral and emotional disorders with onset usually occurring in childhood and adolescence: Secondary | ICD-10-CM

## 2015-10-06 MED ORDER — AMPHETAMINE-DEXTROAMPHET ER 20 MG PO CP24: 20.0000 mg | ORAL_CAPSULE | ORAL | Status: DC

## 2015-10-06 NOTE — Telephone Encounter (Signed)
Pt is requesting a refill on her Adderall Rx.    CB: [phone]

## 2015-10-06 NOTE — Telephone Encounter (Signed)
Margaret Horton has been made aware the medication will be ready for pick up tomorrow.    KP

## 2015-10-08 ENCOUNTER — Ambulatory Visit: Payer: BLUE CROSS/BLUE SHIELD | Admitting: Psychology

## 2015-10-27 ENCOUNTER — Ambulatory Visit (INDEPENDENT_AMBULATORY_CARE_PROVIDER_SITE_OTHER): Payer: BLUE CROSS/BLUE SHIELD | Admitting: Psychology

## 2015-10-27 DIAGNOSIS — F8082 Social pragmatic communication disorder: Secondary | ICD-10-CM | POA: Diagnosis not present

## 2015-10-27 DIAGNOSIS — F900 Attention-deficit hyperactivity disorder, predominantly inattentive type: Secondary | ICD-10-CM

## 2015-10-27 DIAGNOSIS — F4322 Adjustment disorder with anxiety: Secondary | ICD-10-CM

## 2015-12-03 ENCOUNTER — Ambulatory Visit (INDEPENDENT_AMBULATORY_CARE_PROVIDER_SITE_OTHER): Payer: BLUE CROSS/BLUE SHIELD | Admitting: Psychology

## 2015-12-03 DIAGNOSIS — F8082 Social pragmatic communication disorder: Secondary | ICD-10-CM

## 2015-12-03 DIAGNOSIS — F900 Attention-deficit hyperactivity disorder, predominantly inattentive type: Secondary | ICD-10-CM

## 2015-12-03 DIAGNOSIS — F4322 Adjustment disorder with anxiety: Secondary | ICD-10-CM | POA: Diagnosis not present

## 2017-02-15 ENCOUNTER — Ambulatory Visit (INDEPENDENT_AMBULATORY_CARE_PROVIDER_SITE_OTHER): Payer: BLUE CROSS/BLUE SHIELD | Admitting: Family Medicine

## 2017-02-15 ENCOUNTER — Encounter: Payer: Self-pay | Admitting: Family Medicine

## 2017-02-15 VITALS — BP 112/72 | HR 96 | Temp 98.4°F | Ht 62.0 in | Wt 141.4 lb

## 2017-02-15 DIAGNOSIS — Z3041 Encounter for surveillance of contraceptive pills: Secondary | ICD-10-CM | POA: Diagnosis not present

## 2017-02-15 DIAGNOSIS — N941 Unspecified dyspareunia: Secondary | ICD-10-CM

## 2017-02-15 DIAGNOSIS — N926 Irregular menstruation, unspecified: Secondary | ICD-10-CM | POA: Diagnosis not present

## 2017-02-15 LAB — POCT URINE PREGNANCY: Preg Test, Ur: NEGATIVE

## 2017-02-15 MED ORDER — NORGESTIM-ETH ESTRAD TRIPHASIC 0.18/0.215/0.25 MG-35 MCG PO TABS: ORAL_TABLET | ORAL | 1 refills | Status: DC

## 2017-02-15 NOTE — Progress Notes (Signed)
Patient ID: Margaret GallusRachel Horton, female    DOB: 04-20-1986  Age: 31 y.o. MRN: [id number]    Subjective:  Subjective  HPI Margaret Horton presents for irregular periods.  She is bleeding after intercourse and having painful intercourse as well.  No other complaints.  She   Review of Systems  Constitutional: Negative for fever.  HENT: Negative for congestion.   Respiratory: Negative for shortness of breath.   Cardiovascular: Negative for chest pain, palpitations and leg swelling.  Gastrointestinal: Negative for abdominal pain, blood in stool, nausea, rectal pain and vomiting.  Genitourinary: Positive for dyspareunia, menstrual problem and pelvic pain. Negative for dysuria and frequency.  Skin: Negative for rash.  Allergic/Immunologic: Negative for environmental allergies.  Neurological: Negative for dizziness and headaches.  Psychiatric/Behavioral: The patient is not nervous/anxious.     History Past Medical History:  Diagnosis Date  . Anxiety   . Attention deficit disorder (ADD)     She has no past surgical history on file.   Her family history includes Cancer (age of onset: 6248) in her father; Diabetes in her paternal grandfather; Hypertension in her father and paternal grandfather; Kidney cancer in her father; Lymphoma in her unknown relative.She reports that she quit smoking about 6 years ago. Her smoking use included Cigarettes. She has a 0.40 pack-year smoking history. She has never used smokeless tobacco. She reports that she drinks about 0.5 oz of alcohol per week . She reports that she does not use drugs.  Current Outpatient Prescriptions on File Prior to Visit  Medication Sig Dispense Refill  . citalopram (CELEXA) 20 MG tablet Take 1 tablet (20 mg total) by mouth daily. 90 tablet 3  . amphetamine-dextroamphetamine (ADDERALL XR) 20 MG 24 hr capsule Take 1 capsule (20 mg total) by mouth every morning. (Patient not taking: Reported on 02/15/2017) 30 capsule 0   No current  facility-administered medications on file prior to visit.      Objective:  Objective  Physical Exam  Constitutional: She is oriented to person, place, and time. She appears well-developed and well-nourished.  HENT:  Head: Normocephalic and atraumatic.  Eyes: Conjunctivae and EOM are normal.  Neck: Normal range of motion. Neck supple. No JVD present. Carotid bruit is not present. No thyromegaly present.  Cardiovascular: Normal rate, regular rhythm and normal heart sounds.   No murmur heard. Pulmonary/Chest: Effort normal and breath sounds normal. No respiratory distress. She has no wheezes. She has no rales. She exhibits no tenderness.  Abdominal: She exhibits no mass. There is no tenderness. There is no rebound and no guarding.  Genitourinary: Vagina normal and uterus normal. Cervix exhibits no motion tenderness, no discharge and no friability. Right adnexum displays no mass, no tenderness and no fullness. Left adnexum displays no mass, no tenderness and no fullness. No vaginal discharge found.  Musculoskeletal: She exhibits no edema.  Neurological: She is alert and oriented to person, place, and time.  Psychiatric: She has a normal mood and affect.  Nursing note and vitals reviewed.  BP 112/72 (BP Location: Left Arm, Patient Position: Sitting, Cuff Size: Normal)   Pulse 96   Temp 98.4 F (36.9 C) (Oral)   Ht 5\' 2"  (1.575 m)   Wt 141 lb 6 oz (64.1 kg)   SpO2 95%   BMI 25.86 kg/m  Wt Readings from Last 3 Encounters:  02/15/17 141 lb 6 oz (64.1 kg)  09/03/15 129 lb (58.5 kg)  08/20/15 126 lb 6.4 oz (57.3 kg)     Lab Results  Component Value Date   WBC 6.2 08/12/2013   HGB 14.3 08/12/2013   HCT 43.1 08/12/2013   PLT 281.0 08/12/2013   GLUCOSE 98 08/12/2013   CHOL 233 (H) 08/12/2013   TRIG 62.0 08/12/2013   HDL 66.00 08/12/2013   LDLDIRECT 163.5 08/12/2013   LDLCALC 123 (H) 12/15/2010   ALT 15 08/12/2013   AST 22 08/12/2013   NA 138 08/12/2013   K 4.0 08/12/2013   CL  103 08/12/2013   CREATININE 0.7 08/12/2013   BUN 7 08/12/2013   CO2 27 08/12/2013   TSH 1.38 08/12/2013    No results found.   Assessment & Plan:  Plan  I have discontinued Ms. Cheong's metoCLOPramide, ciprofloxacin, fluconazole, and metroNIDAZOLE. I am also having her maintain her citalopram, amphetamine-dextroamphetamine, and Norgestimate-Ethinyl Estradiol Triphasic.  Meds ordered this encounter  Medications  . Norgestimate-Ethinyl Estradiol Triphasic (TRI-SPRINTEC) 0.18/0.215/0.25 MG-35 MCG tablet    Sig: TAKE ONE TABLET BY MOUTH EVERY DAY    Dispense:  3 Package    Refill:  1    Problem List Items Addressed This Visit    None    Visit Diagnoses    Irregular periods    -  Primary   Relevant Orders   US Transvaginal Non-OB   US Pelvis Complete   Encounter for surveillance of contraceptive pills       Relevant Medications   Norgestimate-Ethinyl Estradiol Triphasic (TRI-SPRINTEC) 0.18/0.215/0.25 MG-35 MCG tablet   Dyspareunia in female       Relevant Orders   US Transvaginal Non-OB   US Pelvis Complete    restart bcp, check [place]  If symptoms do not improve --- refer to gyn  Follow-up: Return if symptoms worsen or fail to improve.  Donato Schultz, DO

## 2017-02-15 NOTE — Patient Instructions (Signed)
Dyspareunia, Female Dyspareunia is pain that is associated with sexual activity. This can affect any part of the genitals or lower abdomen, and there are many possible causes. This condition ranges from mild to severe. Depending on the cause, dyspareunia may get better with treatment, or it may return (recur) over time. What are the causes? The cause of this condition is not always known. Possible causes include:  Cancer.  Psychological factors, such as depression, anxiety, or previous traumatic experiences.  Severe pain and tenderness of the skin around the vagina (vulva) when it is touched (vulvar vestibulitis syndrome).  Infection of the pelvis or the vulva.  Infection of the vagina.  Painful, involuntary tightening (contraction) of the vaginal muscles when anything is put inside the vagina (vaginismus).  Allergic reaction.  Ovarian cysts.  Solid growths of tissue (tumors) in the ovaries or the uterus.  Scar tissue in the ovaries, vagina, or pelvis.  Vaginal dryness.  Thinning of the tissue (atrophy) of the vulva and vagina.  Skin conditions that affect the vulva (vulvar dermatoses), such as lichen sclerosus or lichen planus.  Endometriosis.  Tubal pregnancy.  A tilted uterus.  Uterine prolapse.  Adhesions in the vagina.  Bladder problems.  Intestinal problems.  Certain medicines.  Medical conditions such as diabetes, arthritis, or thyroid disease.  What increases the risk? The following factors may make you more likely to develop this condition:  Having experienced physical or sexual trauma.  Having given birth more than once.  Taking birth control pills.  Having gone through menopause.  Having recently given birth, typically within the past 3-6 months.  Breastfeeding.  What are the signs or symptoms? The main symptom of this condition is pain in any part of the genitals or lower abdomen during or after sexual activity. This may include pain  during sexual arousal, genital stimulation, or orgasm. Pain may get worse when anything is inserted into the vagina, or when the genitals are touched in any way, such as when sitting or wearing pants. Pain can range from mild to severe, depending on the cause of the condition. In some cases, symptoms go away with treatment and return (recur) at a later date. How is this diagnosed? This condition may be diagnosed based on:  Your symptoms, including: ? Where your pain is located. ? When your pain occurs.  Your medical history.  A physical exam. This may include a pelvic exam and a Pap test. This is a screening test that is used to check for signs of cancer of the vagina, cervix, and uterus.  Tests, including: ? Blood tests. ? Ultrasound. This uses sound waves to make a picture of the area that is being tested. ? Urine culture. This test involves checking a urine sample for signs of infection. ? Culture test. This is when your health care provider uses a swab to collect a sample of vaginal fluid. The sample is checked for signs of infection. ? X-rays. ? MRI. ? CT scan. ? Laparoscopy. This is a procedure in which a small incision is made in your lower abdomen and a lighted, pencil-sized instrument (laparoscope) is passed through the incision and used to look inside your pelvis.  You may be referred to a health care provider who specializes in women's health (gynecologist). In some cases, diagnosing the cause of dyspareunia can be difficult. How is this treated? Treatment depends on the cause of your condition and your symptoms. In most cases, you may need to stop sexual activity until your symptoms   improve. Treatment may include:  Lubricants.  Kegel exercises or vaginal dilators.  Medicated skin creams.  Medicated vaginal creams.  Hormonal therapy.  Antibiotic medicine to prevent or fight infection.  Medicines that help to relieve pain.  Medicines that treat depression  (antidepressants).  Psychological counseling.  Sex therapy.  Surgery.  Follow these instructions at home: Lifestyle  Avoid tight clothing and irritating materials around your genital and abdominal area.  Use water-based lubricants as needed. Avoid oil-based lubricants.  Do not use any products that irritate you. This may include certain condoms, spermicides, lubricants, soaps, tampons, vaginal sprays, or douches.  Always practice safe sex. Talk with your health care provider about which form of birth control (contraception) is best for you.  Maintain open communication with your sexual partner. General instructions  Take over-the-counter and prescription medicines only as told by your health care provider.  If you had tests done, it is your responsibility to get your tests results. Ask your health care provider or the department performing the test when your results will be ready.  Urinate before you engage in sexual activity.  Consider joining a support group.  Keep all follow-up visits as told by your health care provider. This is important. Contact a health care provider if:  You develop vaginal bleeding after sexual intercourse.  You develop a lump at the opening of your vagina. Seek medical care even if the lump is painless.  You have: ? Abnormal vaginal discharge. ? Vaginal dryness. ? Itchiness or irritation of your vulva or vagina. ? A new rash. ? Symptoms that get worse or do not improve with treatment. ? A fever. ? Pain when you urinate. ? Blood in your urine. Get help right away if:  You develop severe pain in your abdomen during or shortly after sexual intercourse.  You pass out after having sexual intercourse. This information is not intended to replace advice given to you by your health care provider. Make sure you discuss any questions you have with your health care provider. Document Released: 07/09/2007 Document Revised: 10/29/2015 Document  Reviewed: 01/19/2015 Elsevier Interactive Patient Education  2018 Elsevier Inc[employer]  

## 2017-02-15 NOTE — Progress Notes (Signed)
Pre visit review using our clinic review tool, if applicable. No additional management support is needed unless otherwise documented below in the visit note. 

## 2017-03-23 ENCOUNTER — Ambulatory Visit (INDEPENDENT_AMBULATORY_CARE_PROVIDER_SITE_OTHER): Payer: BLUE CROSS/BLUE SHIELD | Admitting: Family Medicine

## 2017-03-23 ENCOUNTER — Other Ambulatory Visit (HOSPITAL_COMMUNITY)
Admission: RE | Admit: 2017-03-23 | Discharge: 2017-03-23 | Disposition: A | Discharge status: Home / Self Care | Payer: BLUE CROSS/BLUE SHIELD | Source: Ambulatory Visit | Attending: Family Medicine | Admitting: Family Medicine

## 2017-03-23 ENCOUNTER — Encounter: Payer: Self-pay | Admitting: Family Medicine

## 2017-03-23 VITALS — BP 108/68 | HR 94 | Temp 98.4°F | Ht 62.0 in | Wt 148.0 lb

## 2017-03-23 DIAGNOSIS — R350 Frequency of micturition: Secondary | ICD-10-CM | POA: Diagnosis not present

## 2017-03-23 DIAGNOSIS — L298 Other pruritus: Secondary | ICD-10-CM | POA: Diagnosis not present

## 2017-03-23 DIAGNOSIS — N898 Other specified noninflammatory disorders of vagina: Secondary | ICD-10-CM

## 2017-03-23 LAB — POCT URINALYSIS DIPSTICK
Bilirubin, UA: NEGATIVE
Blood, UA: NEGATIVE
Glucose, UA: NEGATIVE
Ketones, UA: NEGATIVE
Nitrite, UA: NEGATIVE
Protein, UA: NEGATIVE
Spec Grav, UA: 1.025 (ref 1.010–1.025)
Urobilinogen, UA: 0.2 E.U./dL
pH, UA: 6 (ref 5.0–8.0)

## 2017-03-23 LAB — COMPREHENSIVE METABOLIC PANEL
ALT: 8 U/L (ref 0–35)
AST: 14 U/L (ref 0–37)
Albumin: 3.9 g/dL (ref 3.5–5.2)
Alkaline Phosphatase: 32 U/L — ABNORMAL LOW (ref 39–117)
BUN: 11 mg/dL (ref 6–23)
CO2: 32 mEq/L (ref 19–32)
Calcium: 9.2 mg/dL (ref 8.4–10.5)
Chloride: 102 mEq/L (ref 96–112)
Creatinine, Ser: 0.78 mg/dL (ref 0.40–1.20)
GFR: 92.03 mL/min (ref >60.00)
Glucose, Bld: 97 mg/dL (ref 70–99)
Potassium: 4.1 mEq/L (ref 3.5–5.1)
Sodium: 138 mEq/L (ref 135–145)
Total Bilirubin: 0.5 mg/dL (ref 0.2–1.2)
Total Protein: 7 g/dL (ref 6.0–8.3)

## 2017-03-23 LAB — CBC WITH DIFFERENTIAL/PLATELET
Basophils Absolute: 0.1 10*3/uL (ref 0.0–0.1)
Basophils Relative: 0.6 % (ref 0.0–3.0)
Eosinophils Absolute: 0.1 10*3/uL (ref 0.0–0.7)
Eosinophils Relative: 1.6 % (ref 0.0–5.0)
HCT: 40.1 % (ref 36.0–46.0)
Hemoglobin: 13.4 g/dL (ref 12.0–15.0)
Lymphocytes Relative: 20.5 % (ref 12.0–46.0)
Lymphs Abs: 1.7 10*3/uL (ref 0.7–4.0)
MCHC: 33.4 g/dL (ref 30.0–36.0)
MCV: 93.7 fl (ref 78.0–100.0)
Monocytes Absolute: 0.5 10*3/uL (ref 0.1–1.0)
Monocytes Relative: 5.5 % (ref 3.0–12.0)
Neutro Abs: 5.9 10*3/uL (ref 1.4–7.7)
Neutrophils Relative %: 71.8 % (ref 43.0–77.0)
Platelets: 248 10*3/uL (ref 150.0–400.0)
RBC: 4.28 Mil/uL (ref 3.87–5.11)
RDW: 12.5 % (ref 11.5–15.5)
WBC: 8.2 10*3/uL (ref 4.0–10.5)

## 2017-03-23 MED ORDER — CIPROFLOXACIN HCL 250 MG PO TABS: 250.0000 mg | ORAL_TABLET | Freq: Two times a day (BID) | ORAL | 0 refills | Status: DC

## 2017-03-23 NOTE — Patient Instructions (Signed)

## 2017-03-23 NOTE — Progress Notes (Signed)
Subjective:    Patient ID: Margaret Horton, female    DOB: [birth year], 31 y.o.   MRN: [id number]  Chief Complaint  Patient presents with  . Acute Visit    Sx started x 1 month ago and has tried some otc but no releif   . Urinary Frequency  . Vaginal Itching  . Vaginal Discharge    HPI Patient is in today for urinary frequency, no dysuria,  No d/c , no abd pain -- symptoms x 1 month She has used AZO  Past Medical History:  Diagnosis Date  . Anxiety   . Attention deficit disorder (ADD)     No past surgical history on file.  Family History  Problem Relation Age of Onset  . Hypertension Father   . Kidney cancer Father   . Cancer Father 51       lyphoma  . Lymphoma Unknown   . Hypertension Paternal Grandfather   . Diabetes Paternal Grandfather     Social History   Social History  . Marital status: Single    Spouse name: N/A  . Number of children: N/A  . Years of education: N/A   Occupational History  . [employer]    Social History Main Topics  . Smoking status: Former Smoker    Packs/day: 0.10    Years: 4.00    Types: Cigarettes    Quit date: 01/30/2011  . Smokeless tobacco: Never Used  . Alcohol use 0.5 oz/week    1 drink(s) per week     Comment: very rare  . Drug use: No  . Sexual activity: Yes    Partners: Male    Birth control/ protection: Pill   Other Topics Concern  . Not on file   Social History Narrative   Exercising 20 min a day    Outpatient Medications Prior to Visit  Medication Sig Dispense Refill  . Norgestimate-Ethinyl Estradiol Triphasic (TRI-SPRINTEC) 0.18/0.215/0.25 MG-35 MCG tablet TAKE ONE TABLET BY MOUTH EVERY DAY 3 Package 1  . amphetamine-dextroamphetamine (ADDERALL XR) 20 MG 24 hr capsule Take 1 capsule (20 mg total) by mouth every morning. (Patient not taking: Reported on 02/15/2017) 30 capsule 0  . citalopram (CELEXA) 20 MG tablet Take 1 tablet (20 mg total) by mouth daily. (Patient not taking: Reported on 03/23/2017)  90 tablet 3   No facility-administered medications prior to visit.     No Known Allergies  Review of Systems  Constitutional: Negative for fever and malaise/fatigue.  HENT: Negative for congestion.   Eyes: Negative for blurred vision.  Respiratory: Negative for cough and shortness of breath.   Cardiovascular: Negative for chest pain, palpitations and leg swelling.  Gastrointestinal: Negative for vomiting.  Genitourinary: Positive for dysuria and frequency.  Musculoskeletal: Negative for back pain.  Skin: Negative for rash.  Neurological: Negative for loss of consciousness and headaches.       Objective:    Physical Exam  Abdominal: Soft. She exhibits no distension. There is no tenderness. There is no rebound, no guarding and no CVA tenderness.  Genitourinary: Pelvic exam was performed with patient supine. There is no rash, tenderness, lesion or injury on the right labia. There is no rash, tenderness, lesion or injury on the left labia. No erythema or tenderness in the vagina. No vaginal discharge found.  Nursing note and vitals reviewed.   BP 108/68 (BP Location: Right Arm)   Pulse 94   Temp 98.4 F (36.9 C) (Oral)   Ht  (1.575 m)  Wt 148 lb (67.1 kg)   LMP 03/16/2017   SpO2 97%   BMI 27.07 kg/m  Wt Readings from Last 3 Encounters:  03/23/17 148 lb (67.1 kg)  02/15/17 141 lb 6 oz (64.1 kg)  09/03/15 129 lb (58.5 kg)     Lab Results  Component Value Date   WBC 8.2 03/23/2017   HGB 13.4 03/23/2017   HCT 40.1 03/23/2017   PLT 248.0 03/23/2017   GLUCOSE 97 03/23/2017   CHOL 233 (H) 08/12/2013   TRIG 62.0 08/12/2013   HDL 66.00 08/12/2013   LDLDIRECT 163.5 08/12/2013   LDLCALC 123 (H) 12/15/2010   ALT 8 03/23/2017   AST 14 03/23/2017   NA 138 03/23/2017   K 4.1 03/23/2017   CL 102 03/23/2017   CREATININE 0.78 03/23/2017   BUN 11 03/23/2017   CO2 32 03/23/2017   TSH 1.38 08/12/2013    Lab Results  Component Value Date   TSH 1.38 08/12/2013    Lab Results  Component Value Date   WBC 8.2 03/23/2017   HGB 13.4 03/23/2017   HCT 40.1 03/23/2017   MCV 93.7 03/23/2017   PLT 248.0 03/23/2017   Lab Results  Component Value Date   NA 138 03/23/2017   K 4.1 03/23/2017   CO2 32 03/23/2017   GLUCOSE 97 03/23/2017   BUN 11 03/23/2017   CREATININE 0.78 03/23/2017   BILITOT 0.5 03/23/2017   ALKPHOS 32 (L) 03/23/2017   AST 14 03/23/2017   ALT 8 03/23/2017   PROT 7.0 03/23/2017   ALBUMIN 3.9 03/23/2017   CALCIUM 9.2 03/23/2017   GFR 92.03 03/23/2017   Lab Results  Component Value Date   CHOL 233 (H) 08/12/2013   Lab Results  Component Value Date   HDL 66.00 08/12/2013   Lab Results  Component Value Date   LDLCALC 123 (H) 12/15/2010   Lab Results  Component Value Date   TRIG 62.0 08/12/2013   Lab Results  Component Value Date   CHOLHDL 4 08/12/2013   No results found for: HGBA1C     Assessment & Plan:   Problem List Items Addressed This Visit    None    Visit Diagnoses    Urinary frequency    -  Primary   Relevant Medications   ciprofloxacin (CIPRO) 250 MG tablet   Other Relevant Orders   POCT urinalysis dipstick (Completed)   Urine cytology ancillary only   Urine Culture (Completed)   POCT urine pregnancy   RPR (Completed)   HIV antibody (with reflex) (Completed)   CBC with Differential/Platelet (Completed)   Comprehensive metabolic panel (Completed)   Vaginal itching       Relevant Orders   Urine cytology ancillary only   Urine Culture (Completed)   POCT urine pregnancy   RPR (Completed)   HIV antibody (with reflex) (Completed)   CBC with Differential/Platelet (Completed)   Comprehensive metabolic panel (Completed)      I am having Ms. Walthour start on ciprofloxacin. I am also having her maintain her citalopram, amphetamine-dextroamphetamine, and Norgestimate-Ethinyl Estradiol Triphasic.  Meds ordered this encounter  Medications  . ciprofloxacin (CIPRO) 250 MG tablet    Sig: Take 1 tablet  (250 mg total) by mouth 2 (two) times daily.    Dispense:  6 tablet    Refill:  0     Donato Schultz, DO

## 2017-03-24 LAB — URINE CULTURE
MICRO NUMBER:: 81047334
SPECIMEN QUALITY:: ADEQUATE

## 2017-03-24 LAB — RPR: RPR Ser Ql: NONREACTIVE

## 2017-03-24 LAB — HIV ANTIBODY (ROUTINE TESTING W REFLEX): HIV 1&2 Ab, 4th Generation: NONREACTIVE

## 2017-03-26 LAB — URINE CYTOLOGY ANCILLARY ONLY
Chlamydia: NEGATIVE
Neisseria Gonorrhea: NEGATIVE
Trichomonas: NEGATIVE

## 2017-03-28 ENCOUNTER — Other Ambulatory Visit: Payer: Self-pay | Admitting: Family Medicine

## 2017-03-28 LAB — URINE CYTOLOGY ANCILLARY ONLY: Candida vaginitis: NEGATIVE

## 2017-03-28 MED ORDER — METRONIDAZOLE 500 MG PO TABS
500.0000 mg | ORAL_TABLET | Freq: Two times a day (BID) | ORAL | 0 refills | Status: DC
Start: 2017-03-28 — End: 2019-12-25

## 2017-06-13 ENCOUNTER — Telehealth: Payer: Self-pay | Admitting: Family Medicine

## 2017-06-13 NOTE — Telephone Encounter (Signed)
Copied from CRM [id number]. Topic: Quick Communication - See Telephone Encounter >> Jun 13, 2017 10:27 AM Eston Mouldavis, Adaiah Morken B wrote: CRM for notification. See Telephone encounter for:  Pt wants advice on birth control,  wheather she should stop taking it if she thinks she is pregnant. Pt takes tri sprintec 06/13/17.

## 2017-06-15 NOTE — Telephone Encounter (Signed)
Left message on machine to call back  Per Dr. Laury AxonLowne she should not take the pills if she thinks she is pregnant. She needs to take a pregnancy test and come in for testing if she likes.

## 2017-06-19 NOTE — Telephone Encounter (Signed)
Patient stated that she did an at home test and it was negative.  She will retest after the holidays.  Advised that if she thinks she is pregnant to not take pills

## 2017-07-05 ENCOUNTER — Telehealth: Payer: Self-pay

## 2017-07-05 NOTE — Telephone Encounter (Signed)
Pt called wanting to discuss  Billing. Pt states something was coded wrong and need to changed.

## 2017-07-17 ENCOUNTER — Telehealth: Payer: Self-pay | Admitting: Family Medicine

## 2017-07-17 NOTE — Telephone Encounter (Signed)
Left message on machine that patient can look on mychart to print of labs done and can compare.  Advised to call back with any further questions

## 2017-07-17 NOTE — Telephone Encounter (Signed)
Spoke with patient & mother regarding lab billing for 03/23/17 visit. Main concerns are that they would like to compare testing that was done with a complete panel. Advised patient that I will have someone clinical to assist in explanation of the lab testing. Please advise.

## 2017-07-17 NOTE — Telephone Encounter (Signed)
Pt notified to go to mychart to print out labs

## 2017-10-27 DIAGNOSIS — X30XXXA Exposure to excessive natural heat, initial encounter: Secondary | ICD-10-CM | POA: Diagnosis not present

## 2017-10-27 DIAGNOSIS — R55 Syncope and collapse: Secondary | ICD-10-CM | POA: Diagnosis not present

## 2017-10-27 DIAGNOSIS — T671XXA Heat syncope, initial encounter: Secondary | ICD-10-CM | POA: Diagnosis not present

## 2017-10-27 DIAGNOSIS — R404 Transient alteration of awareness: Secondary | ICD-10-CM | POA: Diagnosis not present

## 2017-10-27 DIAGNOSIS — E860 Dehydration: Secondary | ICD-10-CM | POA: Diagnosis not present

## 2019-11-12 ENCOUNTER — Other Ambulatory Visit: Payer: Self-pay

## 2019-11-12 ENCOUNTER — Ambulatory Visit: Payer: BC Managed Care – PPO | Admitting: Medical

## 2019-11-12 ENCOUNTER — Other Ambulatory Visit (HOSPITAL_COMMUNITY)
Admission: RE | Admit: 2019-11-12 | Discharge: 2019-11-12 | Disposition: A | Discharge status: Home / Self Care | Payer: BC Managed Care – PPO | Source: Ambulatory Visit | Attending: Medical | Admitting: Medical

## 2019-11-12 VITALS — BP 128/81 | HR 89 | Temp 97.0°F | Resp 18 | Ht 62.0 in | Wt 156.6 lb

## 2019-11-12 DIAGNOSIS — R102 Pelvic and perineal pain: Secondary | ICD-10-CM

## 2019-11-12 DIAGNOSIS — Z202 Contact with and (suspected) exposure to infections with a predominantly sexual mode of transmission: Secondary | ICD-10-CM

## 2019-11-12 DIAGNOSIS — R87610 Atypical squamous cells of undetermined significance on cytologic smear of cervix (ASC-US): Secondary | ICD-10-CM

## 2019-11-12 LAB — POC URINALSYSI DIPSTICK (AUTOMATED)
Bilirubin, UA: NEGATIVE
Glucose, UA: NEGATIVE
Ketones, UA: NEGATIVE
Nitrite, UA: NEGATIVE
Protein, UA: NEGATIVE
Spec Grav, UA: 1.02 (ref 1.010–1.025)
Urobilinogen, UA: 0.2 E.U./dL
pH, UA: 6 (ref 5.0–8.0)

## 2019-11-12 NOTE — Progress Notes (Signed)
Subjective:    Patient ID: Margaret Horton, female    DOB: Jan 30, 1986, 34 y.o.   MRN: [id number]  HPI  Pt in for std testing/concerns.  Pt states that she had prior partner who she hears thru friends may have had std. She had sex with that person in January 2021.   Pt states that person may have had hpv.  Pt has no vaginal dc, no lesion, and no pelvic.  Pt states her last papsmear was 4 years. Normal pap. She also states had vaccine against hpv. But 2012 has ascus.   Pt has had cold sores in past.  lmp- past Friday.  Review of Systems  Constitutional: Negative for chills, fatigue and unexpected weight change.  Respiratory: Negative for cough, chest tightness, shortness of breath, wheezing and stridor.   Cardiovascular: Negative for chest pain and palpitations.  Gastrointestinal: Negative for abdominal pain, blood in stool and nausea.  Genitourinary: Negative for dyspareunia, flank pain, frequency, pelvic pain and urgency.  Musculoskeletal: Negative for back pain.  Hematological: Negative for adenopathy. Does not bruise/bleed easily.  Psychiatric/Behavioral: Negative for behavioral problems.    Past Medical History:  Diagnosis Date  . Anxiety   . Attention deficit disorder (ADD)      Social History   Socioeconomic History  . Marital status: Single    Spouse name: Not on file  . Number of children: Not on file  . Years of education: Not on file  . Highest education level: Not on file  Occupational History  . Occupation: [employer]  Tobacco Use  . Smoking status: Former Smoker    Packs/day: 0.10    Years: 4.00    Pack years: 0.40    Types: Cigarettes    Quit date: 01/30/2011    Years since quitting: 8.7  . Smokeless tobacco: Never Used  Substance and Sexual Activity  . Alcohol use: Yes    Alcohol/week: 1.0 standard drinks    Types: 1 drink(s) per week    Comment: very rare  . Drug use: No  . Sexual activity: Yes    Partners: Male    Birth  control/protection: Pill  Other Topics Concern  . Not on file  Social History Narrative   Exercising 20 min a day   Social Determinants of Health   Financial Resource Strain:   . Difficulty of Paying Living Expenses:   Food Insecurity:   . Worried About Charity fundraiser in the Last Year:   . Arboriculturist in the Last Year:   Transportation Needs:   . Film/video editor (Medical):   Marland Kitchen Lack of Transportation (Non-Medical):   Physical Activity:   . Days of Exercise per Week:   . Minutes of Exercise per Session:   Stress:   . Feeling of Stress :   Social Connections:   . Frequency of Communication with Friends and Family:   . Frequency of Social Gatherings with Friends and Family:   . Attends Religious Services:   . Active Member of Clubs or Organizations:   . Attends Archivist Meetings:   Marland Kitchen Marital Status:   Intimate Partner Violence:   . Fear of Current or Ex-Partner:   . Emotionally Abused:   Marland Kitchen Physically Abused:   . Sexually Abused:     No past surgical history on file.  Family History  Problem Relation Age of Onset  . Hypertension Father   . Kidney cancer Father   . Cancer Father 41  lyphoma  . Lymphoma Unknown   . Hypertension Paternal Grandfather   . Diabetes Paternal Grandfather     No Known Allergies  Current Outpatient Medications on File Prior to Visit  Medication Sig Dispense Refill  . amphetamine-dextroamphetamine (ADDERALL XR) 20 MG 24 hr capsule Take 1 capsule (20 mg total) by mouth every morning. (Patient not taking: Reported on 02/15/2017) 30 capsule 0  . ciprofloxacin (CIPRO) 250 MG tablet Take 1 tablet (250 mg total) by mouth 2 (two) times daily. (Patient not taking: Reported on 11/12/2019) 6 tablet 0  . citalopram (CELEXA) 20 MG tablet Take 1 tablet (20 mg total) by mouth daily. (Patient not taking: Reported on 03/23/2017) 90 tablet 3  . metroNIDAZOLE (FLAGYL) 500 MG tablet Take 1 tablet (500 mg total) by mouth 2 (two) times  daily. Take for 7 days. (Patient not taking: Reported on 11/12/2019) 14 tablet 0  . Norgestimate-Ethinyl Estradiol Triphasic (TRI-SPRINTEC) 0.18/0.215/0.25 MG-35 MCG tablet TAKE ONE TABLET BY MOUTH EVERY DAY (Patient not taking: Reported on 11/12/2019) 3 Package 1   No current facility-administered medications on file prior to visit.    BP 128/81   Pulse 89   Temp (!) 97 F (36.1 C) (Temporal)   Resp 18   Ht 5\' 2"  (1.575 m)   Wt 156 lb 9.6 oz (71 kg)   LMP 11/07/2019   SpO2 98%   BMI 28.64 kg/m       Objective:   Physical Exam   General- No acute distress. Pleasant patient. Neck- Full range of motion, no jvd Lungs- Clear, even and unlabored. Heart- regular rate and rhythm. Neurologic- CNII- XII grossly intact. Abdomen- soft, mild suprapubic tenderness, nd, +bs, no rebound or guarding and no organomegaly. Back- no cva tenderness.     Assessment & Plan:  For potential std exposure will get urine ancillary studies, rpr, and hiv studies.  For suprapubic tenderness will get poct ua and urine culture.  Will wait for your studies to come back and treat accordingly.  Please schedule with Dr. 01/07/2020 in next month to repeat pap as you has ascus on 2015 pap and on review it appears was not repeated. This is very important.  Follow up with pcp or sooner if needed  Time spent with patient today was 20  minutes which consisted of chart rediew, discussing  differential diagnosis, work up, potential  treatment and documentation.

## 2019-11-12 NOTE — Patient Instructions (Signed)
For potential std exposure will get urine ancillary studies, rpr, and hiv studies.  For suprapubic tenderness will get poct ua and urine culture.  Will wait for your studies to come back and treat accordingly.  Please schedule with Dr. Laury Axon in next month to repeat pap as you has ascus on 2015 pap and on review it appears was not repeated. This is very important.  Follow up with pcp or sooner if needed

## 2019-11-13 LAB — URINE CYTOLOGY ANCILLARY ONLY
Chlamydia: NEGATIVE
Comment: NEGATIVE
Comment: NEGATIVE
Comment: NORMAL
Neisseria Gonorrhea: NEGATIVE
Trichomonas: NEGATIVE

## 2019-11-13 LAB — URINE CULTURE
MICRO NUMBER:: 10469247
SPECIMEN QUALITY:: ADEQUATE

## 2019-12-25 ENCOUNTER — Encounter: Payer: Self-pay | Admitting: Family Medicine

## 2019-12-25 ENCOUNTER — Other Ambulatory Visit: Payer: Self-pay

## 2019-12-25 ENCOUNTER — Ambulatory Visit: Payer: BC Managed Care – PPO | Admitting: Family Medicine

## 2019-12-25 ENCOUNTER — Other Ambulatory Visit (HOSPITAL_COMMUNITY)
Admission: RE | Admit: 2019-12-25 | Discharge: 2019-12-25 | Disposition: A | Discharge status: Home / Self Care | Payer: BC Managed Care – PPO | Source: Ambulatory Visit | Attending: Family Medicine | Admitting: Family Medicine

## 2019-12-25 VITALS — BP 119/76 | HR 70 | Temp 97.3°F | Resp 16 | Ht 62.0 in | Wt 160.0 lb

## 2019-12-25 DIAGNOSIS — Z23 Encounter for immunization: Secondary | ICD-10-CM | POA: Diagnosis not present

## 2019-12-25 DIAGNOSIS — Z0000 Encounter for general adult medical examination without abnormal findings: Secondary | ICD-10-CM

## 2019-12-25 DIAGNOSIS — Z0001 Encounter for general adult medical examination with abnormal findings: Secondary | ICD-10-CM | POA: Diagnosis not present

## 2019-12-25 DIAGNOSIS — Z01419 Encounter for gynecological examination (general) (routine) without abnormal findings: Secondary | ICD-10-CM | POA: Insufficient documentation

## 2019-12-25 DIAGNOSIS — N760 Acute vaginitis: Secondary | ICD-10-CM | POA: Diagnosis not present

## 2019-12-25 DIAGNOSIS — Z1159 Encounter for screening for other viral diseases: Secondary | ICD-10-CM

## 2019-12-25 LAB — CBC WITH DIFFERENTIAL/PLATELET
Basophils Absolute: 0 10*3/uL (ref 0.0–0.1)
Basophils Relative: 0.5 % (ref 0.0–3.0)
Eosinophils Absolute: 0.1 10*3/uL (ref 0.0–0.7)
Eosinophils Relative: 1.8 % (ref 0.0–5.0)
HCT: 42.1 % (ref 36.0–46.0)
Hemoglobin: 14 g/dL (ref 12.0–15.0)
Lymphocytes Relative: 24.6 % (ref 12.0–46.0)
Lymphs Abs: 1.9 10*3/uL (ref 0.7–4.0)
MCHC: 33.3 g/dL (ref 30.0–36.0)
MCV: 93 fl (ref 78.0–100.0)
Monocytes Absolute: 0.4 10*3/uL (ref 0.1–1.0)
Monocytes Relative: 5.9 % (ref 3.0–12.0)
Neutro Abs: 5.1 10*3/uL (ref 1.4–7.7)
Neutrophils Relative %: 67.2 % (ref 43.0–77.0)
Platelets: 258 10*3/uL (ref 150.0–400.0)
RBC: 4.52 Mil/uL (ref 3.87–5.11)
RDW: 13.3 % (ref 11.5–15.5)
WBC: 7.6 10*3/uL (ref 4.0–10.5)

## 2019-12-25 LAB — COMPREHENSIVE METABOLIC PANEL
ALT: 10 U/L (ref 0–35)
AST: 15 U/L (ref 0–37)
Albumin: 4.3 g/dL (ref 3.5–5.2)
Alkaline Phosphatase: 43 U/L (ref 39–117)
BUN: 12 mg/dL (ref 6–23)
CO2: 31 mEq/L (ref 19–32)
Calcium: 9.6 mg/dL (ref 8.4–10.5)
Chloride: 103 mEq/L (ref 96–112)
Creatinine, Ser: 0.87 mg/dL (ref 0.40–1.20)
GFR: 74.99 mL/min (ref >60.00)
Glucose, Bld: 97 mg/dL (ref 70–99)
Potassium: 4.2 mEq/L (ref 3.5–5.1)
Sodium: 138 mEq/L (ref 135–145)
Total Bilirubin: 0.6 mg/dL (ref 0.2–1.2)
Total Protein: 7 g/dL (ref 6.0–8.3)

## 2019-12-25 LAB — LIPID PANEL
Cholesterol: 172 mg/dL (ref 0–200)
HDL: 53.2 mg/dL (ref >39.00)
LDL Cholesterol: 102 mg/dL — ABNORMAL HIGH (ref 0–99)
NonHDL: 119.18
Total CHOL/HDL Ratio: 3
Triglycerides: 84 mg/dL (ref 0.0–149.0)
VLDL: 16.8 mg/dL (ref 0.0–40.0)

## 2019-12-25 LAB — TSH: TSH: 1.56 u[IU]/mL (ref 0.35–4.50)

## 2019-12-25 MED ORDER — FLUCONAZOLE 150 MG PO TABS: ORAL_TABLET | ORAL | 0 refills | Status: DC

## 2019-12-25 NOTE — Patient Instructions (Signed)
Preventive Care 21-33 Years Old, Female Preventive care refers to visits with your health care provider and lifestyle choices that can promote health and wellness. This includes:  A yearly physical exam. This may also be called an annual well check.  Regular dental visits and eye exams.  Immunizations.  Screening for certain conditions.  Healthy lifestyle choices, such as eating a healthy diet, getting regular exercise, not using drugs or products that contain nicotine and tobacco, and limiting alcohol use. What can I expect for my preventive care visit? Physical exam Your health care provider will check your:  Height and weight. This may be used to calculate body mass index (BMI), which tells if you are at a healthy weight.  Heart rate and blood pressure.  Skin for abnormal spots. Counseling Your health care provider may ask you questions about your:  Alcohol, tobacco, and drug use.  Emotional well-being.  Home and relationship well-being.  Sexual activity.  Eating habits.  Work and work environment.  Method of birth control.  Menstrual cycle.  Pregnancy history. What immunizations do I need?  Influenza (flu) vaccine  This is recommended every year. Tetanus, diphtheria, and pertussis (Tdap) vaccine  You may need a Td booster every 10 years. Varicella (chickenpox) vaccine  You may need this if you have not been vaccinated. Human papillomavirus (HPV) vaccine  If recommended by your health care provider, you may need three doses over 6 months. Measles, mumps, and rubella (MMR) vaccine  You may need at least one dose of MMR. You may also need a second dose. Meningococcal conjugate (MenACWY) vaccine  One dose is recommended if you are age 19-21 years and a first-year college student living in a residence hall, or if you have one of several medical conditions. You may also need additional booster doses. Pneumococcal conjugate (PCV13) vaccine  You may need  this if you have certain conditions and were not previously vaccinated. Pneumococcal polysaccharide (PPSV23) vaccine  You may need one or two doses if you smoke cigarettes or if you have certain conditions. Hepatitis A vaccine  You may need this if you have certain conditions or if you travel or work in places where you may be exposed to hepatitis A. Hepatitis B vaccine  You may need this if you have certain conditions or if you travel or work in places where you may be exposed to hepatitis B. Haemophilus influenzae type b (Hib) vaccine  You may need this if you have certain conditions. You may receive vaccines as individual doses or as more than one vaccine together in one shot (combination vaccines). Talk with your health care provider about the risks and benefits of combination vaccines. What tests do I need?  Blood tests  Lipid and cholesterol levels. These may be checked every 5 years starting at age 20.  Hepatitis C test.  Hepatitis B test. Screening  Diabetes screening. This is done by checking your blood sugar (glucose) after you have not eaten for a while (fasting).  Sexually transmitted disease (STD) testing.  BRCA-related cancer screening. This may be done if you have a family history of breast, ovarian, tubal, or peritoneal cancers.  Pelvic exam and Pap test. This may be done every 3 years starting at age 21. Starting at age 30, this may be done every 5 years if you have a Pap test in combination with an HPV test. Talk with your health care provider about your test results, treatment options, and if necessary, the need for more tests.   Follow these instructions at home: Eating and drinking   Eat a diet that includes fresh fruits and vegetables, whole grains, lean protein, and low-fat dairy.  Take vitamin and mineral supplements as recommended by your health care provider.  Do not drink alcohol if: ? Your health care provider tells you not to drink. ? You are  pregnant, may be pregnant, or are planning to become pregnant.  If you drink alcohol: ? Limit how much you have to 0-1 drink a day. ? Be aware of how much alcohol is in your drink. In the [place] one drink equals one 12 oz bottle of beer (355 mL), one 5 oz glass of wine (148 mL), or one 1 oz glass of hard liquor (44 mL). Lifestyle  Take daily care of your teeth and gums.  Stay active. Exercise for at least 30 minutes on 5 or more days each week.  Do not use any products that contain nicotine or tobacco, such as cigarettes, e-cigarettes, and chewing tobacco. If you need help quitting, ask your health care provider.  If you are sexually active, practice safe sex. Use a condom or other form of birth control (contraception) in order to prevent pregnancy and STIs (sexually transmitted infections). If you plan to become pregnant, see your health care provider for a preconception visit. What's next?  Visit your health care provider once a year for a well check visit.  Ask your health care provider how often you should have your eyes and teeth checked.  Stay up to date on all vaccines. This information is not intended to replace advice given to you by your health care provider. Make sure you discuss any questions you have with your health care provider. Document Revised: 02/28/2018 Document Reviewed: 02/28/2018 Elsevier Patient Education  2020 [employer].

## 2019-12-25 NOTE — Progress Notes (Signed)
Subjective:     Margaret Horton is a 34 y.o. female and is here for a comprehensive physical exam. The patient reports problems - vaginal d/c , itchy x week .d/c is thin and white   No odor   Social History   Socioeconomic History  . Marital status: Single    Spouse name: Not on file  . Number of children: Not on file  . Years of education: Not on file  . Highest education level: Not on file  Occupational History  . Occupation: [employer]  Tobacco Use  . Smoking status: Former Smoker    Packs/day: 0.10    Years: 4.00    Pack years: 0.40    Types: Cigarettes    Quit date: 01/30/2011    Years since quitting: 8.9  . Smokeless tobacco: Never Used  Substance and Sexual Activity  . Alcohol use: Yes    Alcohol/week: 1.0 standard drink    Types: 1 drink(s) per week    Comment: very rare  . Drug use: No  . Sexual activity: Yes    Partners: Male    Birth control/protection: Pill  Other Topics Concern  . Not on file  Social History Narrative   Exercising 20 min a day   Social Determinants of Health   Financial Resource Strain:   . Difficulty of Paying Living Expenses:   Food Insecurity:   . Worried About Charity fundraiser in the Last Year:   . Arboriculturist in the Last Year:   Transportation Needs:   . Film/video editor (Medical):   Marland Kitchen Lack of Transportation (Non-Medical):   Physical Activity:   . Days of Exercise per Week:   . Minutes of Exercise per Session:   Stress:   . Feeling of Stress :   Social Connections:   . Frequency of Communication with Friends and Family:   . Frequency of Social Gatherings with Friends and Family:   . Attends Religious Services:   . Active Member of Clubs or Organizations:   . Attends Archivist Meetings:   Marland Kitchen Marital Status:   Intimate Partner Violence:   . Fear of Current or Ex-Partner:   . Emotionally Abused:   Marland Kitchen Physically Abused:   . Sexually Abused:    Health Maintenance  Topic Date Due  . Hepatitis C  Screening  Never done  . COVID-19 Vaccine (1) Never done  . PAP SMEAR-Modifier  08/12/2016  . TETANUS/TDAP  07/28/2019  . INFLUENZA VACCINE  02/01/2020  . HIV Screening  Completed    The following portions of the patient's history were reviewed and updated as appropriate:  She  has a past medical history of Anxiety and Attention deficit disorder (ADD). She does not have any pertinent problems on file. She  has no past surgical history on file. Her family history includes Cancer (age of onset: 78) in her father; Diabetes in her paternal grandfather; Hypertension in her father and paternal grandfather; Kidney cancer in her father; Lymphoma in her unknown relative. She  reports that she quit smoking about 8 years ago. Her smoking use included cigarettes. She has a 0.40 pack-year smoking history. She has never used smokeless tobacco. She reports current alcohol use of about 1.0 standard drink of alcohol per week. She reports that she does not use drugs. She has a current medication list which includes the following prescription(s): amphetamine-dextroamphetamine, citalopram, and norgestimate-ethinyl estradiol triphasic. Current Outpatient Medications on File Prior to Visit  Medication  Sig Dispense Refill  . amphetamine-dextroamphetamine (ADDERALL XR) 20 MG 24 hr capsule Take 1 capsule (20 mg total) by mouth every morning. 30 capsule 0  . citalopram (CELEXA) 20 MG tablet Take 1 tablet (20 mg total) by mouth daily. 90 tablet 3  . Norgestimate-Ethinyl Estradiol Triphasic (TRI-SPRINTEC) 0.18/0.215/0.25 MG-35 MCG tablet TAKE ONE TABLET BY MOUTH EVERY DAY 3 Package 1   No current facility-administered medications on file prior to visit.   She has No Known Allergies..  Review of Systems Review of Systems  Constitutional: Negative for activity change, appetite change and fatigue.  HENT: Negative for hearing loss, congestion, tinnitus and ear discharge.  dentist q64m Eyes: Negative for visual  disturbance (see optho q1y -- vision corrected to 20/20 with glasses).  Respiratory: Negative for cough, chest tightness and shortness of breath.   Cardiovascular: Negative for chest pain, palpitations and leg swelling.  Gastrointestinal: Negative for abdominal pain, diarrhea, constipation and abdominal distention.  Genitourinary: Negative for urgency, frequency, decreased urine volume and difficulty urinating.   + vag d/c  Musculoskeletal: Negative for back pain, arthralgias and gait problem.  Skin: Negative for color change, pallor and rash.  Neurological: Negative for dizziness, light-headedness, numbness and headaches.  Hematological: Negative for adenopathy. Does not bruise/bleed easily.  Psychiatric/Behavioral: Negative for suicidal ideas, confusion, sleep disturbance, self-injury, dysphoric mood, decreased concentration and agitation.       Objective:    BP 119/76 (BP Location: Left Arm, Patient Position: Sitting, Cuff Size: Small)   Pulse 70   Temp (!) 97.3 F (36.3 C) (Temporal)   Resp 16   Ht 5\' 2"  (1.575 m)   Wt 160 lb (72.6 kg)   SpO2 100%   BMI 29.26 kg/m  General appearance: alert, cooperative and no distress Head: Normocephalic, without obvious abnormality, atraumatic Eyes: negative findings: lids and lashes normal, conjunctivae and sclerae normal and pupils equal, round, reactive to light and accomodation Ears: normal TM's and external ear canals both ears Neck: no adenopathy, no carotid bruit, no JVD, supple, symmetrical, trachea midline and thyroid not enlarged, symmetric, no tenderness/mass/nodules Back: symmetric, no curvature. ROM normal. No CVA tenderness. Lungs: clear to auscultation bilaterally Breasts: normal appearance, no masses or tenderness Heart: regular rate and rhythm, S1, S2 normal, no murmur, click, rub or gallop Abdomen: soft, non-tender; bowel sounds normal; no masses,  no organomegaly Pelvic: cervix normal in appearance, external genitalia  normal, no adnexal masses or tenderness, no cervical motion tenderness, positive findings: vaginal discharge:  copious, white and thin, rectovaginal septum normal, uterus normal size, shape, and consistency and vagina normal without discharge Extremities: extremities normal, atraumatic, no cyanosis or edema Pulses: 2+ and symmetric Skin: Skin color, texture, turgor normal. No rashes or lesions Lymph nodes: Cervical, supraclavicular, and axillary nodes normal. Neurologic: Alert and oriented X 3, normal strength and tone. Normal symmetric reflexes. Normal coordination and gait    Assessment:    Healthy female exam.      Plan:     ghm utd Check labs See After Visit Summary for Counseling Recommendations    1. Need for tetanus booster  - Tdap vaccine greater than or equal to 7yo IM  2. Need for hepatitis C screening test  - Hepatitis C antibody  3. Preventative health care Check labs  See above  - TSH - Lipid panel - CBC with Differential/Platelet - Comprehensive metabolic panel  4. Acute vaginitis Check wet prep  - POCT Urinalysis Dipstick (Automated) - fluconazole (DIFLUCAN) 150 MG tablet; 1 po x1,  may repeat in 3 days prn  Dispense: 2 tablet; Refill: 0 - Cervicovaginal ancillary only  5. Encounter for routine gynecological examination with Papanicolaou smear of cervix  - Cytology - PAP( [facility])

## 2019-12-26 LAB — CYTOLOGY - PAP
Comment: NEGATIVE
Diagnosis: NEGATIVE
High risk HPV: NEGATIVE

## 2019-12-26 LAB — CERVICOVAGINAL ANCILLARY ONLY
Bacterial Vaginitis (gardnerella): POSITIVE — AB
Candida Glabrata: NEGATIVE
Candida Vaginitis: NEGATIVE
Chlamydia: NEGATIVE
Comment: NEGATIVE
Comment: NEGATIVE
Comment: NEGATIVE
Comment: NEGATIVE
Comment: NEGATIVE
Comment: NORMAL
Neisseria Gonorrhea: NEGATIVE
Trichomonas: NEGATIVE

## 2019-12-26 LAB — HEPATITIS C ANTIBODY
Hepatitis C Ab: NONREACTIVE
SIGNAL TO CUT-OFF: 0.02 (ref <1.00)

## 2019-12-27 ENCOUNTER — Other Ambulatory Visit: Payer: Self-pay | Admitting: Family Medicine

## 2019-12-27 DIAGNOSIS — B9689 Other specified bacterial agents as the cause of diseases classified elsewhere: Secondary | ICD-10-CM

## 2019-12-27 DIAGNOSIS — N760 Acute vaginitis: Secondary | ICD-10-CM

## 2019-12-27 MED ORDER — METRONIDAZOLE 500 MG PO TABS: 500.0000 mg | ORAL_TABLET | Freq: Two times a day (BID) | ORAL | 0 refills | Status: DC

## 2020-06-10 DIAGNOSIS — U071 COVID-19: Secondary | ICD-10-CM | POA: Diagnosis not present

## 2020-06-10 DIAGNOSIS — Z20828 Contact with and (suspected) exposure to other viral communicable diseases: Secondary | ICD-10-CM | POA: Diagnosis not present

## 2020-07-10 DIAGNOSIS — Z20822 Contact with and (suspected) exposure to covid-19: Secondary | ICD-10-CM | POA: Diagnosis not present

## 2020-12-29 DIAGNOSIS — M9904 Segmental and somatic dysfunction of sacral region: Secondary | ICD-10-CM | POA: Diagnosis not present

## 2020-12-29 DIAGNOSIS — M5417 Radiculopathy, lumbosacral region: Secondary | ICD-10-CM | POA: Diagnosis not present

## 2020-12-29 DIAGNOSIS — M9903 Segmental and somatic dysfunction of lumbar region: Secondary | ICD-10-CM | POA: Diagnosis not present

## 2020-12-29 DIAGNOSIS — M62830 Muscle spasm of back: Secondary | ICD-10-CM | POA: Diagnosis not present

## 2020-12-30 DIAGNOSIS — M9903 Segmental and somatic dysfunction of lumbar region: Secondary | ICD-10-CM | POA: Diagnosis not present

## 2020-12-30 DIAGNOSIS — M5417 Radiculopathy, lumbosacral region: Secondary | ICD-10-CM | POA: Diagnosis not present

## 2020-12-30 DIAGNOSIS — M62830 Muscle spasm of back: Secondary | ICD-10-CM | POA: Diagnosis not present

## 2020-12-30 DIAGNOSIS — M9904 Segmental and somatic dysfunction of sacral region: Secondary | ICD-10-CM | POA: Diagnosis not present

## 2021-01-04 ENCOUNTER — Ambulatory Visit (INDEPENDENT_AMBULATORY_CARE_PROVIDER_SITE_OTHER): Payer: BC Managed Care – PPO | Admitting: Family Medicine

## 2021-01-04 ENCOUNTER — Other Ambulatory Visit: Payer: Self-pay

## 2021-01-04 ENCOUNTER — Encounter: Payer: Self-pay | Admitting: Family Medicine

## 2021-01-04 VITALS — BP 100/70 | HR 78 | Temp 98.5°F | Resp 18 | Ht 62.0 in | Wt 163.4 lb

## 2021-01-04 DIAGNOSIS — M5442 Lumbago with sciatica, left side: Secondary | ICD-10-CM | POA: Diagnosis not present

## 2021-01-04 DIAGNOSIS — Z0000 Encounter for general adult medical examination without abnormal findings: Secondary | ICD-10-CM

## 2021-01-04 DIAGNOSIS — Z0001 Encounter for general adult medical examination with abnormal findings: Secondary | ICD-10-CM | POA: Diagnosis not present

## 2021-01-04 DIAGNOSIS — N760 Acute vaginitis: Secondary | ICD-10-CM | POA: Diagnosis not present

## 2021-01-04 MED ORDER — METHOCARBAMOL 500 MG PO TABS: 500.0000 mg | ORAL_TABLET | Freq: Four times a day (QID) | ORAL | 1 refills | Status: DC

## 2021-01-04 NOTE — Progress Notes (Signed)
Subjective:     Margaret Horton is a 35 y.o. female and is here for a comprehensive physical exam. The patient reports Low back pain x 2 weeks--- - she did fall 3 weeks ago while she was carrying her puppy.   Pain radiates to L thigh.  No other complaints   IB helps the pain  Social History   Socioeconomic History   Marital status: Single    Spouse name: Not on file   Number of children: Not on file   Years of education: Not on file   Highest education level: Not on file  Occupational History   Occupation: [employer]  Tobacco Use   Smoking status: Former    Packs/day: 0.10    Years: 4.00    Pack years: 0.40    Types: Cigarettes    Quit date: 01/30/2011    Years since quitting: 9.9   Smokeless tobacco: Never  Substance and Sexual Activity   Alcohol use: Yes    Alcohol/week: 1.0 standard drink    Types: 1 drink(s) per week    Comment: very rare   Drug use: No   Sexual activity: Yes    Partners: Male    Birth control/protection: Pill  Other Topics Concern   Not on file  Social History Narrative   Exercising 20 min a day   Social Determinants of Health   Financial Resource Strain: Not on file  Food Insecurity: Not on file  Transportation Needs: Not on file  Physical Activity: Not on file  Stress: Not on file  Social Connections: Not on file  Intimate Partner Violence: Not on file   Health Maintenance  Topic Date Due   COVID-19 Vaccine (1) Never done   Pneumococcal Vaccine 42-57 Years old (1 - PCV) Never done   INFLUENZA VACCINE  01/31/2021   PAP SMEAR-Modifier  12/25/2022   TETANUS/TDAP  12/24/2029   HPV VACCINES  Completed   Hepatitis C Screening  Completed   HIV Screening  Completed    The following portions of the patient's history were reviewed and updated as appropriate: allergies, current medications, past family history, past medical history, past social history, past surgical history, and problem list.  Review of Systems Review of Systems   Constitutional: Negative for activity change, appetite change and fatigue.  HENT: Negative for hearing loss, congestion, tinnitus and ear discharge.  dentist q91m Eyes: Negative for visual disturbance (see optho q1y -- vision corrected to 20/20 with glasses).  Respiratory: Negative for cough, chest tightness and shortness of breath.   Cardiovascular: Negative for chest pain, palpitations and leg swelling.  Gastrointestinal: Negative for abdominal pain, diarrhea, constipation and abdominal distention.  Genitourinary: Negative for urgency, frequency, decreased urine volume and difficulty urinating.  Musculoskeletal: Negative for back pain, arthralgias and gait problem.  Skin: Negative for color change, pallor and rash.  Neurological: Negative for dizziness, light-headedness, numbness and headaches.  Hematological: Negative for adenopathy. Does not bruise/bleed easily.  Psychiatric/Behavioral: Negative for suicidal ideas, confusion, sleep disturbance, self-injury, dysphoric mood, decreased concentration and agitation.      Objective:    BP 100/70 (BP Location: Right Arm, Patient Position: Sitting, Cuff Size: Normal)   Pulse 78   Temp 98.5 F (36.9 C) (Oral)   Resp 18   Ht 5\' 2"  (1.575 m)   Wt 163 lb 6.4 oz (74.1 kg)   SpO2 97%   BMI 29.89 kg/m  General appearance: alert, cooperative, appears stated age, and no distress Head: Normocephalic, without obvious  abnormality, atraumatic Eyes: conjunctivae/corneas clear. PERRL, EOM's intact. Fundi benign. Throat: lips, mucosa, and tongue normal; teeth and gums normal Neck: no adenopathy, no carotid bruit, no JVD, supple, symmetrical, trachea midline, and thyroid not enlarged, symmetric, no tenderness/mass/nodules Back: symmetric, no curvature. ROM normal. No CVA tenderness. Lungs: clear to auscultation bilaterally Breasts: normal appearance, no masses or tenderness Heart: regular rate and rhythm, S1, S2 normal, no murmur, click, rub or  gallop Abdomen: soft, non-tender; bowel sounds normal; no masses,  no organomegaly Pelvic: deferred Extremities: extremities normal, atraumatic, no cyanosis or edema Pulses: 2+ and symmetric Skin: Skin color, texture, turgor normal. No rashes or lesions Lymph nodes: Cervical, supraclavicular, and axillary nodes normal. Neurologic: Alert and oriented X 3, normal strength and tone. Normal symmetric reflexes. Normal coordination and gait    Assessment:    Healthy female exam.    Plan:    Ghm utd Check labs  See After Visit Summary for Counseling Recommendations   1. Preventative health care Ghm utd See above  - Lipid panel - CBC with Differential/Platelet - TSH - Comprehensive metabolic panel  2. Acute left-sided low back pain with left-sided sciatica Ice/ heat Xray and f/u ortho / pt if no better  - DG Lumbar Spine Complete; Future - methocarbamol (ROBAXIN) 500 MG tablet; Take 1 tablet (500 mg total) by mouth 4 (four) times daily.  Dispense: 30 tablet; Refill: 1

## 2021-01-04 NOTE — Patient Instructions (Addendum)
Preventive Care 21-34 Years Old, Female Preventive care refers to lifestyle choices and visits with your health care provider that can promote health and wellness. This includes: A yearly physical exam. This is also called an annual wellness visit. Regular dental and eye exams. Immunizations. Screening for certain conditions. Healthy lifestyle choices, such as: Eating a healthy diet. Getting regular exercise. Not using drugs or products that contain nicotine and tobacco. Limiting alcohol use. What can I expect for my preventive care visit? Physical exam Your health care provider may check your: Height and weight. These may be used to calculate your BMI (body mass index). BMI is a measurement that tells if you are at a healthy weight. Heart rate and blood pressure. Body temperature. Skin for abnormal spots. Counseling Your health care provider may ask you questions about your: Past medical problems. Family's medical history. Alcohol, tobacco, and drug use. Emotional well-being. Home life and relationship well-being. Sexual activity. Diet, exercise, and sleep habits. Work and work environment. Access to firearms. Method of birth control. Menstrual cycle. Pregnancy history. What immunizations do I need?  Vaccines are usually given at various ages, according to a schedule. Your health care provider will recommend vaccines for you based on your age, medicalhistory, and lifestyle or other factors, such as travel or where you work. What tests do I need?  Blood tests Lipid and cholesterol levels. These may be checked every 5 years starting at age 20. Hepatitis C test. Hepatitis B test. Screening Diabetes screening. This is done by checking your blood sugar (glucose) after you have not eaten for a while (fasting). STD (sexually transmitted disease) testing, if you are at risk. BRCA-related cancer screening. This may be done if you have a family history of breast, ovarian, tubal, or  peritoneal cancers. Pelvic exam and Pap test. This may be done every 3 years starting at age 21. Starting at age 30, this may be done every 5 years if you have a Pap test in combination with an HPV test. Talk with your health care provider about your test results, treatment options,and if necessary, the need for more tests. Follow these instructions at home: Eating and drinking  Eat a healthy diet that includes fresh fruits and vegetables, whole grains, lean protein, and low-fat dairy products. Take vitamin and mineral supplements as recommended by your health care provider. Do not drink alcohol if: Your health care provider tells you not to drink. You are pregnant, may be pregnant, or are planning to become pregnant. If you drink alcohol: Limit how much you have to 0-1 drink a day. Be aware of how much alcohol is in your drink. In the [place] one drink equals one 12 oz bottle of beer (355 mL), one 5 oz glass of wine (148 mL), or one 1 oz glass of hard liquor (44 mL).  Lifestyle Take daily care of your teeth and gums. Brush your teeth every morning and night with fluoride toothpaste. Floss one time each day. Stay active. Exercise for at least 30 minutes 5 or more days each week. Do not use any products that contain nicotine or tobacco, such as cigarettes, e-cigarettes, and chewing tobacco. If you need help quitting, ask your health care provider. Do not use drugs. If you are sexually active, practice safe sex. Use a condom or other form of protection to prevent STIs (sexually transmitted infections). If you do not wish to become pregnant, use a form of birth control. If you plan to become pregnant, see your health care   provider for a prepregnancy visit. Find healthy ways to cope with stress, such as: Meditation, yoga, or listening to music. Journaling. Talking to a trusted person. Spending time with friends and family. Safety Always wear your seat belt while driving or riding in a  vehicle. Do not drive: If you have been drinking alcohol. Do not ride with someone who has been drinking. When you are tired or distracted. While texting. Wear a helmet and other protective equipment during sports activities. If you have firearms in your house, make sure you follow all gun safety procedures. Seek help if you have been physically or sexually abused. What's next? Go to your health care provider once a year for an annual wellness visit. Ask your health care provider how often you should have your eyes and teeth checked. Stay up to date on all vaccines. This information is not intended to replace advice given to you by your health care provider. Make sure you discuss any questions you have with your healthcare provider. Document Revised: 02/15/2020 Document Reviewed: 02/28/2018 Elsevier Patient Education  2022 [place]. acute Back Pain, Adult Acute back pain is sudden and usually short-lived. It is often caused by an injury to the muscles and tissues in the back. The injury may result from: A muscle or ligament getting overstretched or torn (strained). Ligaments are tissues that connect bones to each other. Lifting something improperly can cause a back strain. Wear and tear (degeneration) of the spinal disks. Spinal disks are circular tissue that provide cushioning between the bones of the spine (vertebrae). Twisting motions, such as while playing sports or doing yard work. A hit to the back. Arthritis. You may have a physical exam, lab tests, and imaging tests to find the cause ofyour pain. Acute back pain usually goes away with rest and home care. Follow these instructions at home: Managing pain, stiffness, and swelling Treatment may include medicines for pain and inflammation that are taken by mouth or applied to the skin, prescription pain medicine, or muscle relaxants. Take over-the-counter and prescription medicines only as told by your health care provider. Your  health care provider may recommend applying ice during the first 24-48 hours after your pain starts. To do this: Put ice in a plastic bag. Place a towel between your skin and the bag. Leave the ice on for 20 minutes, 2-3 times a day. If directed, apply heat to the affected area as often as told by your health care provider. Use the heat source that your health care provider recommends, such as a moist heat pack or a heating pad. Place a towel between your skin and the heat source. Leave the heat on for 20-30 minutes. Remove the heat if your skin turns bright red. This is especially important if you are unable to feel pain, heat, or cold. You have a greater risk of getting burned. Activity  Do not stay in bed. Staying in bed for more than 1-2 days can delay your recovery. Sit up and stand up straight. Avoid leaning forward when you sit or hunching over when you stand. If you work at a desk, sit close to it so you do not need to lean over. Keep your chin tucked in. Keep your neck drawn back, and keep your elbows bent at a 90-degree angle (right angle). Sit high and close to the steering wheel when you drive. Add lower back (lumbar) support to your car seat, if needed. Take short walks on even surfaces as soon as you are  able. Try to increase the length of time you walk each day. Do not sit, drive, or stand in one place for more than 30 minutes at a time. Sitting or standing for long periods of time can put stress on your back. Do not drive or use heavy machinery while taking prescription pain medicine. Use proper lifting techniques. When you bend and lift, use positions that put less stress on your back: Bradford your knees. Keep the load close to your body. Avoid twisting. Exercise regularly as told by your health care provider. Exercising helps your back heal faster and helps prevent back injuries by keeping muscles strong and flexible. Work with a physical therapist to make a safe exercise  program, as recommended by your health care provider. Do any exercises as told by your physical therapist.  Lifestyle Maintain a healthy weight. Extra weight puts stress on your back and makes it difficult to have good posture. Avoid activities or situations that make you feel anxious or stressed. Stress and anxiety increase muscle tension and can make back pain worse. Learn ways to manage anxiety and stress, such as through exercise. General instructions Sleep on a firm mattress in a comfortable position. Try lying on your side with your knees slightly bent. If you lie on your back, put a pillow under your knees. Follow your treatment plan as told by your health care provider. This may include: Cognitive or behavioral therapy. Acupuncture or massage therapy. Meditation or yoga. Contact a health care provider if: You have pain that is not relieved with rest or medicine. You have increasing pain going down into your legs or buttocks. Your pain does not improve after 2 weeks. You have pain at night. You lose weight without trying. You have a fever or chills. Get help right away if: You develop new bowel or bladder control problems. You have unusual weakness or numbness in your arms or legs. You develop nausea or vomiting. You develop abdominal pain. You feel faint. Summary Acute back pain is sudden and usually short-lived. Use proper lifting techniques. When you bend and lift, use positions that put less stress on your back. Take over-the-counter and prescription medicines and apply heat or ice as directed by your health care provider. This information is not intended to replace advice given to you by your health care provider. Make sure you discuss any questions you have with your healthcare provider. Document Revised: 03/09/2020 Document Reviewed: 03/12/2020 Elsevier Patient Education  2022 [employer].

## 2021-01-05 LAB — CBC WITH DIFFERENTIAL/PLATELET
Basophils Absolute: 0 10*3/uL (ref 0.0–0.1)
Basophils Relative: 0.6 % (ref 0.0–3.0)
Eosinophils Absolute: 0.1 10*3/uL (ref 0.0–0.7)
Eosinophils Relative: 1.8 % (ref 0.0–5.0)
HCT: 40.1 % (ref 36.0–46.0)
Hemoglobin: 13.6 g/dL (ref 12.0–15.0)
Lymphocytes Relative: 36.8 % (ref 12.0–46.0)
Lymphs Abs: 2.6 10*3/uL (ref 0.7–4.0)
MCHC: 34 g/dL (ref 30.0–36.0)
MCV: 92.1 fl (ref 78.0–100.0)
Monocytes Absolute: 0.4 10*3/uL (ref 0.1–1.0)
Monocytes Relative: 5.9 % (ref 3.0–12.0)
Neutro Abs: 3.9 10*3/uL (ref 1.4–7.7)
Neutrophils Relative %: 54.9 % (ref 43.0–77.0)
Platelets: 274 10*3/uL (ref 150.0–400.0)
RBC: 4.36 Mil/uL (ref 3.87–5.11)
RDW: 12.9 % (ref 11.5–15.5)
WBC: 7.2 10*3/uL (ref 4.0–10.5)

## 2021-01-05 LAB — COMPREHENSIVE METABOLIC PANEL
ALT: 11 U/L (ref 0–35)
AST: 16 U/L (ref 0–37)
Albumin: 4.3 g/dL (ref 3.5–5.2)
Alkaline Phosphatase: 41 U/L (ref 39–117)
BUN: 13 mg/dL (ref 6–23)
CO2: 26 mEq/L (ref 19–32)
Calcium: 9.4 mg/dL (ref 8.4–10.5)
Chloride: 102 mEq/L (ref 96–112)
Creatinine, Ser: 0.83 mg/dL (ref 0.40–1.20)
GFR: 92.24 mL/min (ref >60.00)
Glucose, Bld: 85 mg/dL (ref 70–99)
Potassium: 4.2 mEq/L (ref 3.5–5.1)
Sodium: 139 mEq/L (ref 135–145)
Total Bilirubin: 0.4 mg/dL (ref 0.2–1.2)
Total Protein: 7.5 g/dL (ref 6.0–8.3)

## 2021-01-05 LAB — LIPID PANEL
Cholesterol: 177 mg/dL (ref 0–200)
HDL: 51.7 mg/dL (ref >39.00)
LDL Cholesterol: 94 mg/dL (ref 0–99)
NonHDL: 125.7
Total CHOL/HDL Ratio: 3
Triglycerides: 158 mg/dL — ABNORMAL HIGH (ref 0.0–149.0)
VLDL: 31.6 mg/dL (ref 0.0–40.0)

## 2021-01-05 LAB — TSH: TSH: 1.37 u[IU]/mL (ref 0.35–5.50)

## 2021-01-10 ENCOUNTER — Encounter: Payer: Self-pay | Admitting: Family Medicine

## 2021-01-10 NOTE — Telephone Encounter (Signed)
Noted  

## 2021-01-11 ENCOUNTER — Other Ambulatory Visit: Payer: Self-pay

## 2021-01-11 ENCOUNTER — Ambulatory Visit (HOSPITAL_BASED_OUTPATIENT_CLINIC_OR_DEPARTMENT_OTHER)
Admission: RE | Admit: 2021-01-11 | Discharge: 2021-01-11 | Disposition: A | Discharge status: Home / Self Care | Payer: BC Managed Care – PPO | Source: Ambulatory Visit | Attending: Family Medicine | Admitting: Family Medicine

## 2021-01-11 DIAGNOSIS — M5442 Lumbago with sciatica, left side: Secondary | ICD-10-CM | POA: Diagnosis not present

## 2021-01-11 DIAGNOSIS — M545 Low back pain, unspecified: Secondary | ICD-10-CM | POA: Diagnosis not present

## 2021-09-11 ENCOUNTER — Emergency Department (HOSPITAL_BASED_OUTPATIENT_CLINIC_OR_DEPARTMENT_OTHER): Payer: BC Managed Care – PPO

## 2021-09-11 ENCOUNTER — Encounter (HOSPITAL_BASED_OUTPATIENT_CLINIC_OR_DEPARTMENT_OTHER): Payer: Self-pay | Admitting: Emergency Medicine

## 2021-09-11 ENCOUNTER — Other Ambulatory Visit: Payer: Self-pay

## 2021-09-11 ENCOUNTER — Emergency Department (HOSPITAL_BASED_OUTPATIENT_CLINIC_OR_DEPARTMENT_OTHER)
Admission: EM | Admit: 2021-09-11 | Discharge: 2021-09-11 | Disposition: A | Discharge status: Home / Self Care | Payer: BC Managed Care – PPO | Attending: Emergency Medicine | Admitting: Emergency Medicine

## 2021-09-11 DIAGNOSIS — R9431 Abnormal electrocardiogram [ECG] [EKG]: Secondary | ICD-10-CM | POA: Diagnosis not present

## 2021-09-11 DIAGNOSIS — R519 Headache, unspecified: Secondary | ICD-10-CM | POA: Insufficient documentation

## 2021-09-11 DIAGNOSIS — K219 Gastro-esophageal reflux disease without esophagitis: Secondary | ICD-10-CM | POA: Diagnosis not present

## 2021-09-11 DIAGNOSIS — Z20822 Contact with and (suspected) exposure to covid-19: Secondary | ICD-10-CM | POA: Insufficient documentation

## 2021-09-11 DIAGNOSIS — R42 Dizziness and giddiness: Secondary | ICD-10-CM | POA: Diagnosis not present

## 2021-09-11 LAB — URINALYSIS, ROUTINE W REFLEX MICROSCOPIC
Bilirubin Urine: NEGATIVE
Glucose, UA: NEGATIVE mg/dL
Hgb urine dipstick: NEGATIVE
Ketones, ur: NEGATIVE mg/dL
Leukocytes,Ua: NEGATIVE
Nitrite: NEGATIVE
Protein, ur: NEGATIVE mg/dL
Specific Gravity, Urine: 1.025 (ref 1.005–1.030)
pH: 7 (ref 5.0–8.0)

## 2021-09-11 LAB — RESP PANEL BY RT-PCR (FLU A&B, COVID) ARPGX2
Influenza A by PCR: NEGATIVE
Influenza B by PCR: NEGATIVE
SARS Coronavirus 2 by RT PCR: NEGATIVE

## 2021-09-11 LAB — PREGNANCY, URINE: Preg Test, Ur: NEGATIVE

## 2021-09-11 MED ORDER — SODIUM CHLORIDE 0.9 % IV BOLUS
1000.0000 mL | Freq: Once | INTRAVENOUS | Status: AC
  Administered 2021-09-11: 1000 mL via INTRAVENOUS

## 2021-09-11 MED ORDER — LIDOCAINE VISCOUS HCL 2 % MT SOLN
15.0000 mL | Freq: Once | OROMUCOSAL | Status: AC
  Administered 2021-09-11: 15 mL via ORAL
  Filled 2021-09-11: qty 15

## 2021-09-11 MED ORDER — ALUM & MAG HYDROXIDE-SIMETH 200-200-20 MG/5ML PO SUSP
30.0000 mL | Freq: Once | ORAL | Status: AC
  Administered 2021-09-11: 30 mL via ORAL
  Filled 2021-09-11: qty 30

## 2021-09-11 MED ORDER — ACETAMINOPHEN 325 MG PO TABS
650.0000 mg | ORAL_TABLET | Freq: Once | ORAL | Status: AC
Start: 2021-09-11 — End: 2021-09-11
  Administered 2021-09-11: 650 mg via ORAL
  Filled 2021-09-11: qty 2

## 2021-09-11 MED ORDER — LIDOCAINE 5 % EX PTCH
1.0000 | MEDICATED_PATCH | CUTANEOUS | Status: DC
  Administered 2021-09-11: 1 via TRANSDERMAL
  Filled 2021-09-11: qty 1

## 2021-09-11 MED ORDER — KETOROLAC TROMETHAMINE 15 MG/ML IJ SOLN
15.0000 mg | Freq: Once | INTRAMUSCULAR | Status: AC
  Administered 2021-09-11: 15 mg via INTRAVENOUS
  Filled 2021-09-11: qty 1

## 2021-09-11 MED ORDER — BUTALBITAL-APAP-CAFFEINE 50-325-40 MG PO TABS: 1.0000 | ORAL_TABLET | Freq: Four times a day (QID) | ORAL | 0 refills | Status: AC | PRN

## 2021-09-11 MED ORDER — MAGNESIUM SULFATE 2 GM/50ML IV SOLN
2.0000 g | Freq: Once | INTRAVENOUS | Status: AC
  Administered 2021-09-11: 2 g via INTRAVENOUS
  Filled 2021-09-11: qty 50

## 2021-09-11 NOTE — ED Provider Notes (Signed)
[facility] EMERGENCY DEPARTMENT Provider Note   CSN: [id number] Arrival date & time: 09/11/21  0954     History  Chief Complaint  Patient presents with   Headache    Margaret Horton is a 36 y.o. female.  This is a 36 y.o. female with significant medical history as below, including migraines, anxiety, ADD who presents to the ED with complaint of headache.  Globus sensation.  Patient with intermittent headache for the past 4 days.  Headache was not maximal at onset, not associate with neurologic deficit.  She is currently not having headache.  Last time she had a headache was in the car on the way to the hospital.  She tried drinking a soda pop yesterday to help with her symptoms, felt like the drain got stuck in her throat and been having discomfort since then.  She has been tolerating p.o., ate breakfast this morning.   She has been taking Tylenol, motrin, Excedrin Migraine intermittently for headache which has improved her symptoms. Motrin last night.   Patient also with concern for possible BV, she took OTC medication which improved/resolved her symptoms.  No ongoing discharge but having some mild questionable intermittent dysuria.   Past Medical History: No date: Anxiety No date: Attention deficit disorder (ADD)  History reviewed. No pertinent surgical history.    The history is provided by the patient and the spouse. No language interpreter was used.  Headache Pain location:  Generalized Quality:  Stabbing Radiates to:  Does not radiate Severity currently:  0/10 Severity at highest:  8/10 Onset quality:  Gradual     Home Medications Prior to Admission medications   Medication Sig Start Date End Date Taking? Authorizing Provider  butalbital-acetaminophen-caffeine (FIORICET) [phone] MG tablet Take 1 tablet by mouth every 6 (six) hours as needed for headache. 09/11/21 09/11/22 Yes Tanda Rockers A, DO  amphetamine-dextroamphetamine (ADDERALL XR) 20 MG 24 hr  capsule Take 1 capsule (20 mg total) by mouth every morning. 10/06/15   Donato Schultz, DO  citalopram (CELEXA) 20 MG tablet Take 1 tablet (20 mg total) by mouth daily. 08/20/15   Donato Schultz, DO  fluconazole (DIFLUCAN) 150 MG tablet 1 po x1, may repeat in 3 days prn Patient not taking: Reported on 01/04/2021 12/25/19   Zola Button, Grayling Congress, DO  methocarbamol (ROBAXIN) 500 MG tablet Take 1 tablet (500 mg total) by mouth 4 (four) times daily. 01/04/21   Donato Schultz, DO  metroNIDAZOLE (FLAGYL) 500 MG tablet Take 1 tablet (500 mg total) by mouth 2 (two) times daily. Patient not taking: Reported on 01/04/2021 12/27/19   Zola Button, Grayling Congress, DO  Norgestimate-Ethinyl Estradiol Triphasic (TRI-SPRINTEC) 0.18/0.215/0.25 MG-35 MCG tablet TAKE ONE TABLET BY MOUTH EVERY DAY 02/15/17   Donato Schultz, DO      Allergies    Patient has no known allergies.    Review of Systems   Review of Systems  HENT:  Positive for trouble swallowing.   Neurological:  Positive for headaches.  All other systems reviewed and are negative.  Physical Exam Updated Vital Signs BP 135/84    Pulse 79    Temp 98.5 F (36.9 C)    Resp 18    Ht  (1.575 m)    Wt 73.5 kg    LMP 09/07/2021    SpO2 96%    BMI 29.63 kg/m  Physical Exam Vitals and nursing note reviewed.  Constitutional:      General:  She is not in acute distress.    Appearance: Normal appearance. She is well-developed. She is not ill-appearing, toxic-appearing or diaphoretic.  HENT:     Head: Normocephalic and atraumatic.     Right Ear: External ear normal.     Left Ear: External ear normal.     Nose: Nose normal.     Mouth/Throat:     Mouth: Mucous membranes are moist.  Eyes:     General: No scleral icterus.       Right eye: No discharge.        Left eye: No discharge.     Extraocular Movements: Extraocular movements intact.     Pupils: Pupils are equal, round, and reactive to light.  Cardiovascular:     Rate and Rhythm:  Normal rate and regular rhythm.     Pulses: Normal pulses.     Heart sounds: Normal heart sounds.  Pulmonary:     Effort: Pulmonary effort is normal. No respiratory distress.     Breath sounds: Normal breath sounds.  Abdominal:     General: Abdomen is flat.     Tenderness: There is no abdominal tenderness.  Musculoskeletal:        General: Normal range of motion.     Cervical back: Full passive range of motion without pain and normal range of motion.     Right lower leg: No edema.     Left lower leg: No edema.  Skin:    General: Skin is warm and dry.     Capillary Refill: Capillary refill takes less than 2 seconds.  Neurological:     Mental Status: She is alert and oriented to person, place, and time.     GCS: GCS eye subscore is 4. GCS verbal subscore is 5. GCS motor subscore is 6.     Cranial Nerves: Cranial nerves 2-12 are intact. No dysarthria or facial asymmetry.     Sensory: Sensation is intact.     Motor: Motor function is intact. No tremor.     Coordination: Coordination is intact.     Gait: Gait is intact.  Psychiatric:        Mood and Affect: Mood normal.        Behavior: Behavior normal.    ED Results / Procedures / Treatments   Labs (all labs ordered are listed, but only abnormal results are displayed) Labs Reviewed  RESP PANEL BY RT-PCR (FLU A&B, COVID) [id number]  PREGNANCY, URINE  URINALYSIS, ROUTINE W REFLEX MICROSCOPIC    EKG EKG Interpretation  Date/Time:  Sunday September 11 2021 10:13:39 EDT Ventricular Rate:  76 PR Interval:  139 QRS Duration: 109 QT Interval:  421 QTC Calculation: 474 R Axis:   66 Text Interpretation: Sinus rhythm Consider left atrial enlargement similar to prior tracing may/12 no stemi Confirmed by Tanda Rockers ([id number]) on 09/11/2021 10:23:54 AM  Radiology DG Chest 2 View  Result Date: 09/11/2021 CLINICAL DATA:  Indigestion.  Globus sensation. EXAM: CHEST - 2 VIEW COMPARISON:  None. FINDINGS: The heart size and mediastinal contours  are within normal limits. Both lungs are clear. The visualized skeletal structures are unremarkable. IMPRESSION: No active cardiopulmonary disease. Electronically Signed   By: Danae Orleans M.D.   On: 09/11/2021 10:36    Procedures Procedures    Medications Ordered in ED Medications  lidocaine (LIDODERM) 5 % 1 patch (1 patch Transdermal Patch Applied 09/11/21 1039)  sodium chloride 0.9 % bolus 1,000 mL (1,000 mLs Intravenous New Bag/Given 09/11/21 1040)  magnesium  sulfate IVPB 2 g 50 mL (2 g Intravenous New Bag/Given 09/11/21 1039)  ketorolac (TORADOL) 15 MG/ML injection 15 mg (15 mg Intravenous Given 09/11/21 1039)  acetaminophen (TYLENOL) tablet 650 mg (650 mg Oral Given 09/11/21 1039)  alum & mag hydroxide-simeth (MAALOX/MYLANTA) 200-200-20 MG/5ML suspension 30 mL (30 mLs Oral Given 09/11/21 1039)    And  lidocaine (XYLOCAINE) 2 % viscous mouth solution 15 mL (15 mLs Oral Given 09/11/21 1039)    ED Course/ Medical Decision Making/ A&P Clinical Course as of 09/11/21 1130  Sun Sep 11, 2021  1044 CXR reviewed, no acute process noted; agree with rad interpret. I viewed the image. EKG w/o acute ischemia or STEMI. No chest pain at time of assessment.  [SG]    Clinical Course User Index [SG] Sloan LeiterGray, Opal Mckellips A, DO                           Medical Decision Making Amount and/or Complexity of Data Reviewed Labs: ordered. Radiology: ordered.  Risk OTC drugs. Prescription drug management.   Initial Impression and Ddx This patient presents to the Emergency Department for the above complaint. This involves an extensive number of treatment options and is a complaint that carries with it a high risk of complications and morbidity. Vital signs were reviewed. Serious etiologies considered. Ddx includes but is not limited to: Migraine, foreign body, CVA, UTI, other serious etiology  Patient PMH that increases complexity of ED encounter:  anxiety  Previous records obtained and reviewed   Additional  history obtained from spouse   Interpretation of Diagnostics Labs/ imaging results that were available during my care of the patient were visualized by me and considered in my medical decision making.   I ordered imaging studies which included CXR and I visualized the imaging and I agree with radiologist interpretation. No acute process noted. She is tolerating PO, no evidence of obstruction. No drooling, stridor, or trismus.   UA neg, pt reports symptoms concerning for pos BV resolved after OTC medication. No UTI on UA.    Cardiac monitoring reviewed and interpreted personally which shows NSR  Social determinants of health include - N/a Clinical Course as of 09/11/21 1130  Sun Sep 11, 2021  1044 CXR reviewed, no acute process noted; agree with rad interpret. I viewed the image. EKG w/o acute ischemia or STEMI. No chest pain at time of assessment.  [SG]    Clinical Course User Index [SG] Sloan LeiterGray, Nashiya Disbrow A, DO      Patient Reassessment and Ultimate Disposition/Management  Patient management required discussion with the following services or consulting groups:  None  Low risk HEAR score, EKG neg, CXR neg; very low suspicion for ACS  Patient presents with headache. Based on the patient's history and physical there is very low clinical suspicion for significant intracranial pathology. The headache was not sudden onset, not maximal at onset, there are no neurologic findings on exam, the patient does not have a fever, the patient does not have any jaw claudication, the patient does not endorse a clotting disorder, patient denies any trauma or eye pain and the headache is not associated with dizziness, weakness on one side of the body, diplopia, vertigo, slurred speech, or ataxia. Given the extremely low risk of these diagnoses further testing and evaluation for these possibilities does not appear to be indicated at this time.  Symptoms resolved  The patient improved significantly and was  discharged in stable condition. Detailed discussions were  had with the patient regarding current findings, and need for close f/u with PCP or on call doctor. The patient has been instructed to return immediately if the symptoms worsen in any way for re-evaluation. Patient verbalized understanding and is in agreement with current care plan. All questions answered prior to discharge.     Complexity of Problems Addressed Acute complicated illness or Injury  Additional Data Reviewed and Analyzed Further history obtained from: Further history from spouse/family member, Past medical history and medications listed in the EMR, Prior ED visit notes, and Prior labs/imaging results  Patient Encounter Risk Assessment Prescriptions      This chart was dictated using voice recognition software.  Despite best efforts to proofread,  errors can occur which can change the documentation meaning.         Final Clinical Impression(s) / ED Diagnoses Final diagnoses:  Nonintractable headache, unspecified chronicity pattern, unspecified headache type    Rx / DC Orders ED Discharge Orders          Ordered    butalbital-acetaminophen-caffeine (FIORICET) 50-325-40 MG tablet  Every 6 hours PRN        09/11/21 1129              Sloan Leiter, DO 09/11/21 1130

## 2021-09-11 NOTE — Discharge Instructions (Addendum)
It was a pleasure caring for you today in the emergency department. ° °Please return to the emergency department for any worsening or worrisome symptoms. ° ° °

## 2021-09-11 NOTE — ED Triage Notes (Signed)
Pt arrives pov with c/o HA x 3 days, worsening yesterday, "indigestion" starting yesterday after drinking soda. Pt denies n/v at this time. ?Pt AOx4, bilaterally equal ?

## 2021-10-08 IMAGING — DX DG LUMBAR SPINE COMPLETE 4+V
5 series · 5 of 5 positions shown · non-contrast
Comparison: 04/05/2015

CLINICAL DATA: Low back pain.

EXAM:
LUMBAR SPINE - COMPLETE 4+ VIEW

[l-spine ap]
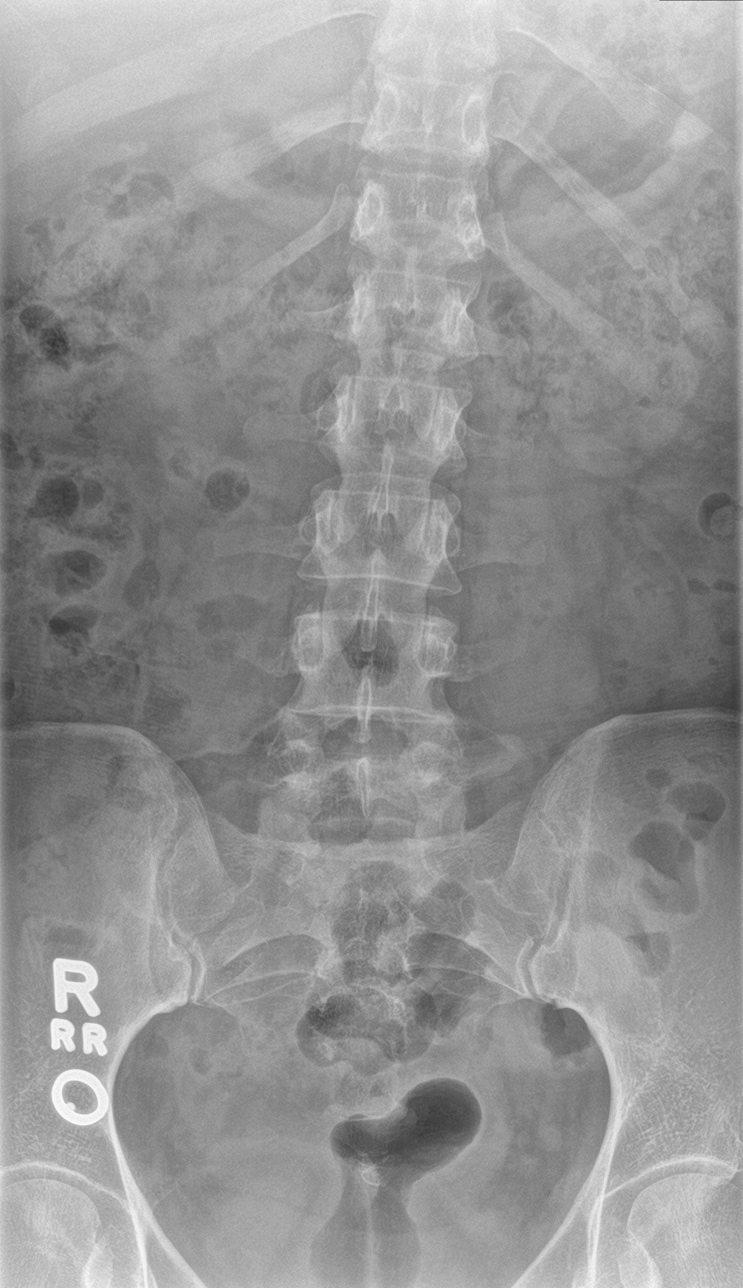

[l-spine obl (1 of 2)]
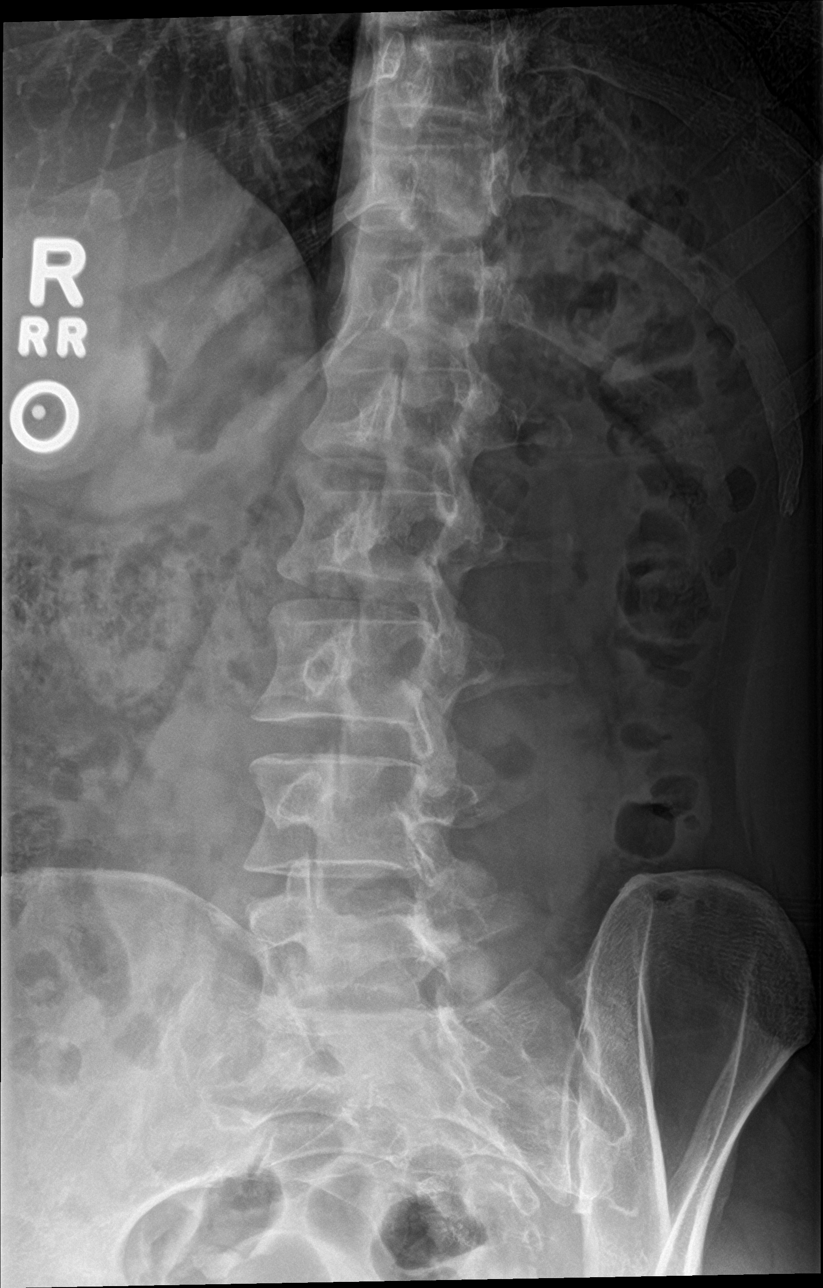

[l-spine obl (2 of 2)]
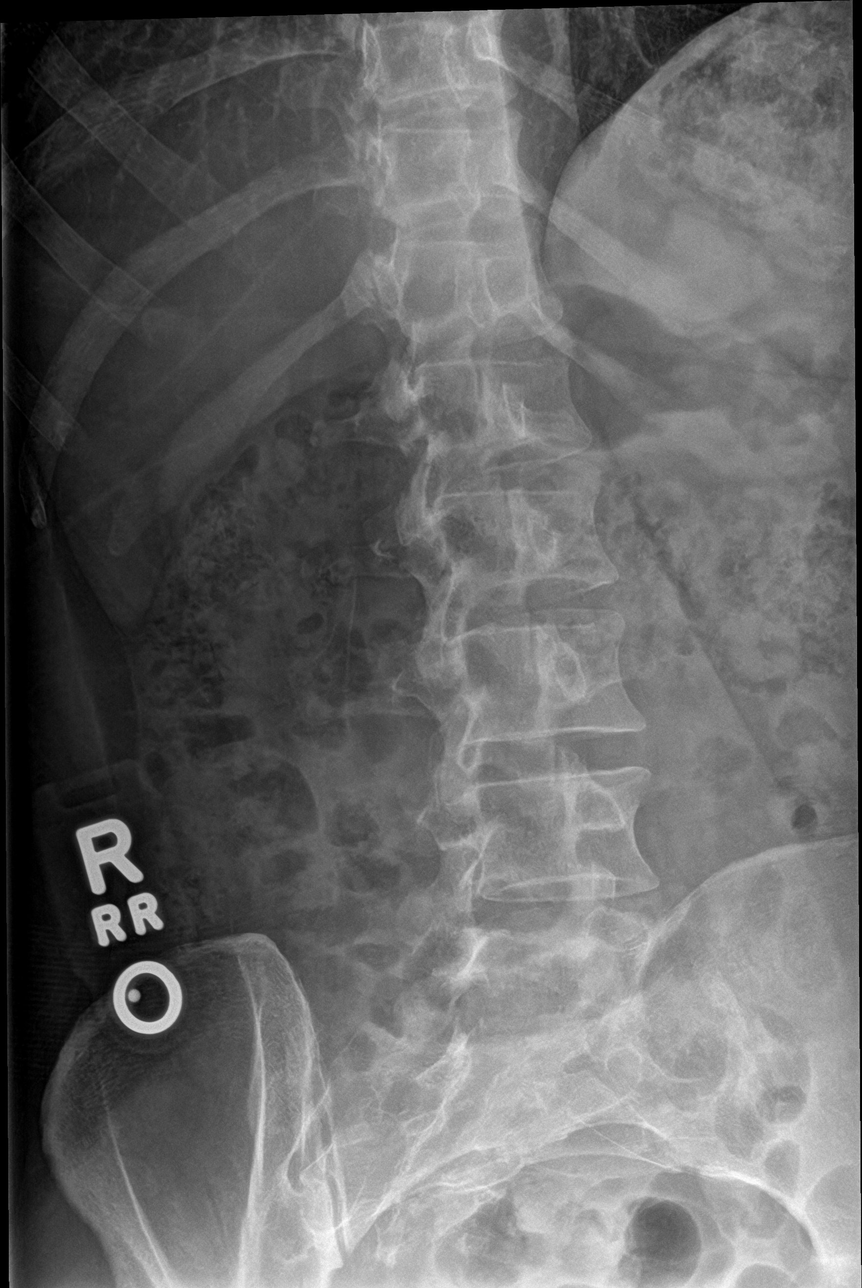

[l-spine lat]
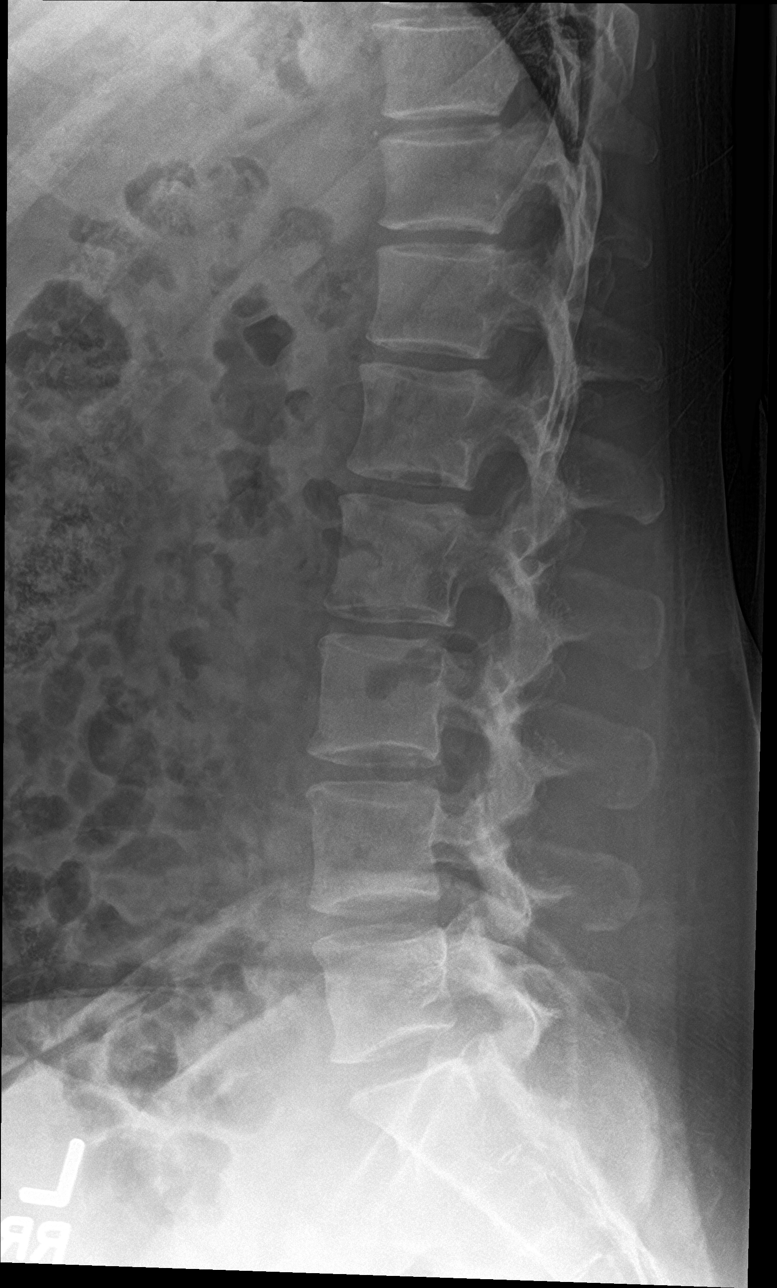

[l-spine spot]
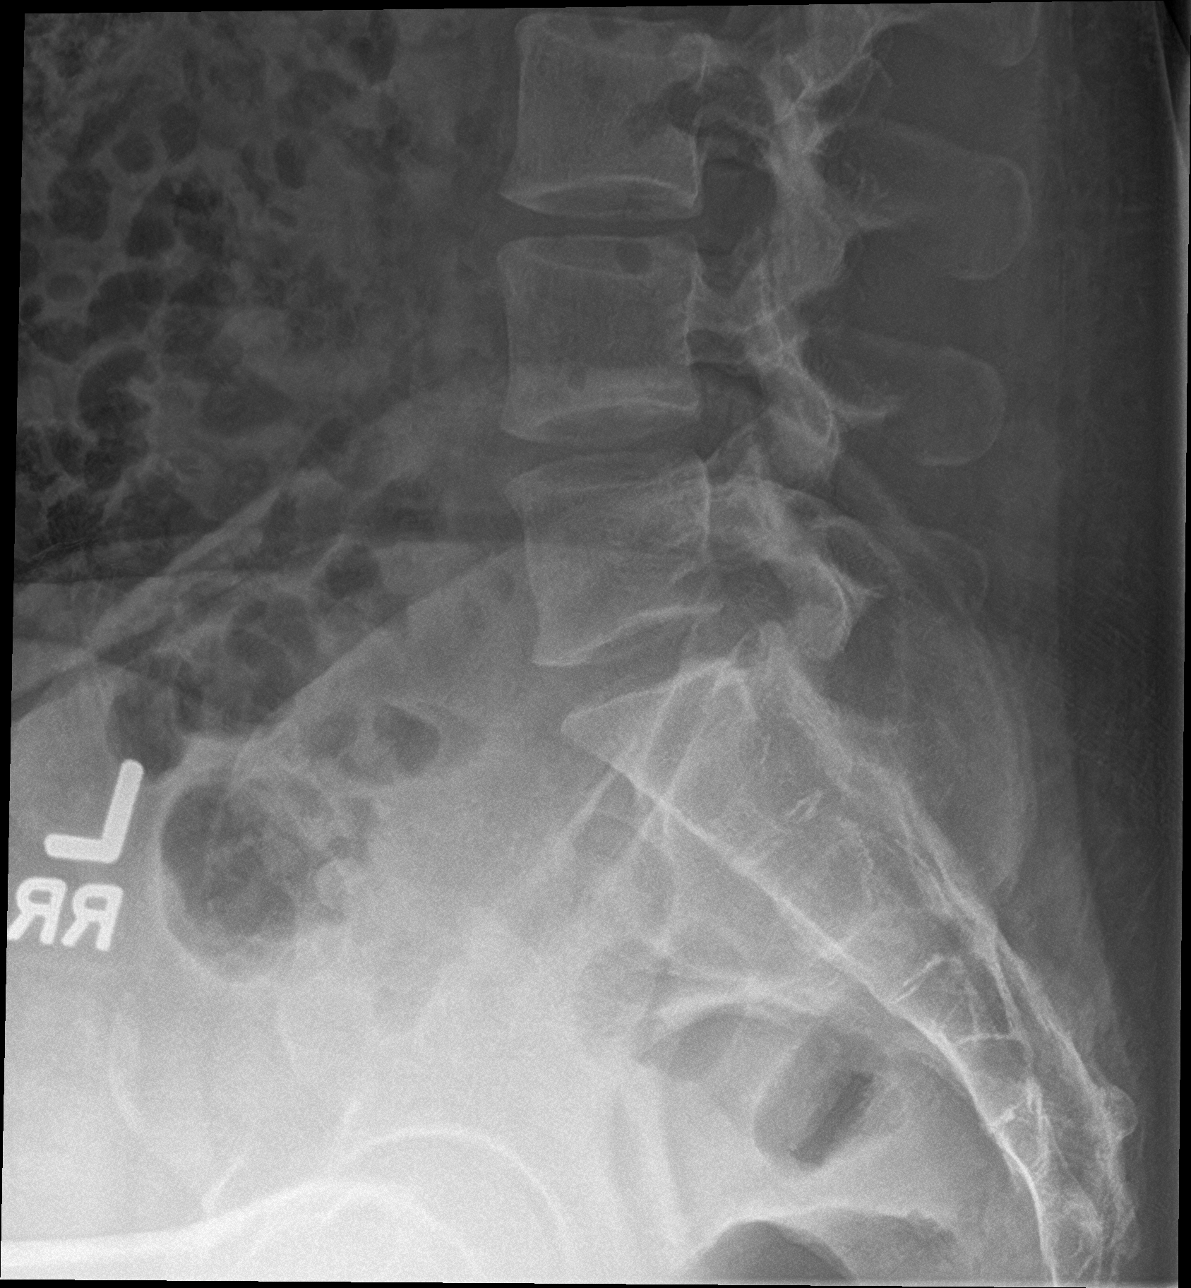

[5 of 5 positions shown; findings below may reference images not displayed]

FINDINGS: There is no evidence of lumbar spine fracture. Alignment is normal.
Intervertebral disc spaces are maintained.
IMPRESSION: Negative.

## 2023-02-26 ENCOUNTER — Ambulatory Visit (INDEPENDENT_AMBULATORY_CARE_PROVIDER_SITE_OTHER): Payer: BC Managed Care – PPO | Admitting: Family Medicine

## 2023-02-26 ENCOUNTER — Other Ambulatory Visit (HOSPITAL_COMMUNITY)
Admission: RE | Admit: 2023-02-26 | Discharge: 2023-02-26 | Disposition: A | Discharge status: Home / Self Care | Payer: BC Managed Care – PPO | Source: Ambulatory Visit | Attending: Family Medicine | Admitting: Family Medicine

## 2023-02-26 ENCOUNTER — Encounter: Payer: Self-pay | Admitting: Family Medicine

## 2023-02-26 VITALS — BP 100/70 | HR 76 | Temp 97.7°F | Resp 18 | Ht 62.0 in | Wt 145.8 lb

## 2023-02-26 DIAGNOSIS — Z13220 Encounter for screening for lipoid disorders: Secondary | ICD-10-CM | POA: Diagnosis not present

## 2023-02-26 DIAGNOSIS — Z3041 Encounter for surveillance of contraceptive pills: Secondary | ICD-10-CM | POA: Diagnosis not present

## 2023-02-26 DIAGNOSIS — Z0000 Encounter for general adult medical examination without abnormal findings: Secondary | ICD-10-CM

## 2023-02-26 DIAGNOSIS — Z01419 Encounter for gynecological examination (general) (routine) without abnormal findings: Secondary | ICD-10-CM | POA: Diagnosis not present

## 2023-02-26 DIAGNOSIS — Z1231 Encounter for screening mammogram for malignant neoplasm of breast: Secondary | ICD-10-CM | POA: Diagnosis not present

## 2023-02-26 MED ORDER — NORGESTIM-ETH ESTRAD TRIPHASIC 0.18/0.215/0.25 MG-35 MCG PO TABS: ORAL_TABLET | ORAL | 3 refills | Status: DC

## 2023-02-26 NOTE — Progress Notes (Signed)
Established Patient Office Visit  Subjective   Patient ID: Margaret Horton, female    DOB: 21-Oct-1985  Age: 37 y.o. MRN: [id number]  Chief Complaint  Patient presents with   Annual Exam    Pt states not fasting     HPI Discussed the use of AI scribe software for clinical note transcription with the patient, who gave verbal consent to proceed.  History of Present Illness   The patient presents for an annual physical. She reports no new health issues and is currently only taking birth control. She has lost thirty two pounds since the beginning of the year through diet and exercise, specifically following a ketogenic diet. She has regular eye and dental check-ups, with the most recent dental visit being last week. She has a history of benign breast cysts but has not noticed any new lumps or changes in her breasts. She has not been performing regular self-breast exams.      Patient Active Problem List   Diagnosis Date Noted   Depression 06/18/2015   OTHER DYSPHAGIA 05/17/2010   TINEA CORPORIS 03/22/2010   DYSCHROMIA, UNSPECIFIED 03/22/2010   BREAST CYST, LEFT 11/24/2009   TWITCHING 07/27/2009   CHLAMYDTRACHOMATIS INFECTION LOWER GU SITES 02/05/2009   BACTERIAL VAGINITIS 01/27/2009   Attention deficit disorder 11/08/2006   Past Medical History:  Diagnosis Date   Anxiety    Attention deficit disorder (ADD)    No past surgical history on file. Social History   Tobacco Use   Smoking status: Former    Current packs/day: 0.00    Average packs/day: 0.1 packs/day for 4.0 years (0.4 ttl pk-yrs)    Types: Cigarettes    Start date: 01/30/2007    Quit date: 01/30/2011    Years since quitting: 12.0   Smokeless tobacco: Never  Substance Use Topics   Alcohol use: Yes    Alcohol/week: 1.0 standard drink of alcohol    Types: 1 drink(s) per week    Comment: occ   Drug use: No   Social History   Socioeconomic History   Marital status: Married    Spouse name: Not on file   Number of  children: Not on file   Years of education: Not on file   Highest education level: Not on file  Occupational History   Occupation: [employer]  Tobacco Use   Smoking status: Former    Current packs/day: 0.00    Average packs/day: 0.1 packs/day for 4.0 years (0.4 ttl pk-yrs)    Types: Cigarettes    Start date: 01/30/2007    Quit date: 01/30/2011    Years since quitting: 12.0   Smokeless tobacco: Never  Substance and Sexual Activity   Alcohol use: Yes    Alcohol/week: 1.0 standard drink of alcohol    Types: 1 drink(s) per week    Comment: occ   Drug use: No   Sexual activity: Yes    Partners: Male    Birth control/protection: Pill  Other Topics Concern   Not on file  Social History Narrative   Exercising 20 min a day   Social Determinants of Health   Financial Resource Strain: Not on file  Food Insecurity: Not on file  Transportation Needs: Not on file  Physical Activity: Not on file  Stress: Not on file  Social Connections: Not on file  Intimate Partner Violence: Not on file   Family Status  Relation Name Status   Father  Alive   PGF  Alive   Mother  Alive  Other  (Not Specified)  No partnership data on file   Family History  Problem Relation Age of Onset   Hypertension Father    Kidney cancer Father    Cancer Father 65       lyphoma   Hypertension Paternal Grandfather    Diabetes Paternal Grandfather    Lymphoma Other    No Known Allergies    Review of Systems  Constitutional:  Negative for chills, fever and malaise/fatigue.  HENT:  Negative for congestion and hearing loss.   Eyes:  Negative for discharge.  Respiratory:  Negative for cough, sputum production and shortness of breath.   Cardiovascular:  Negative for chest pain, palpitations and leg swelling.  Gastrointestinal:  Negative for abdominal pain, blood in stool, constipation, diarrhea, heartburn, nausea and vomiting.  Genitourinary:  Negative for dysuria, frequency, hematuria and urgency.   Musculoskeletal:  Negative for back pain, falls and myalgias.  Skin:  Negative for rash.  Neurological:  Negative for dizziness, sensory change, loss of consciousness, weakness and headaches.  Endo/Heme/Allergies:  Negative for environmental allergies. Does not bruise/bleed easily.  Psychiatric/Behavioral:  Negative for depression and suicidal ideas. The patient is not nervous/anxious and does not have insomnia.       Objective:     BP 100/70 (BP Location: Left Arm, Patient Position: Sitting, Cuff Size: Normal)   Pulse 76   Temp 97.7 F (36.5 C) (Oral)   Resp 18   Ht 5\' 2"  (1.575 m)   Wt 145 lb 12.8 oz (66.1 kg)   SpO2 100%   BMI 26.67 kg/m  BP Readings from Last 3 Encounters:  02/26/23 100/70  09/11/21 117/73  01/04/21 100/70   Wt Readings from Last 3 Encounters:  02/26/23 145 lb 12.8 oz (66.1 kg)  09/11/21 162 lb (73.5 kg)  01/04/21 163 lb 6.4 oz (74.1 kg)   SpO2 Readings from Last 3 Encounters:  02/26/23 100%  09/11/21 97%  01/04/21 97%      Physical Exam Vitals and nursing note reviewed.  Constitutional:      General: She is not in acute distress.    Appearance: Normal appearance. She is well-developed.  HENT:     Head: Normocephalic and atraumatic.     Right Ear: Tympanic membrane, ear canal and external ear normal. There is no impacted cerumen.     Left Ear: Tympanic membrane, ear canal and external ear normal. There is no impacted cerumen.     Nose: Nose normal. No congestion or rhinorrhea.     Right Sinus: No maxillary sinus tenderness or frontal sinus tenderness.     Left Sinus: No maxillary sinus tenderness or frontal sinus tenderness.     Mouth/Throat:     Mouth: Mucous membranes are moist.     Pharynx: Oropharynx is clear. No oropharyngeal exudate or posterior oropharyngeal erythema.  Eyes:     General: No scleral icterus.       Right eye: No discharge.        Left eye: No discharge.     Conjunctiva/sclera: Conjunctivae normal.     Pupils:  Pupils are equal, round, and reactive to light.  Neck:     Thyroid: No thyromegaly or thyroid tenderness.     Vascular: No JVD.  Cardiovascular:     Rate and Rhythm: Normal rate and regular rhythm.     Heart sounds: Normal heart sounds. No murmur heard. Pulmonary:     Effort: Pulmonary effort is normal. No respiratory distress.     Breath  sounds: Normal breath sounds.  Chest:  Breasts:    Breasts are symmetrical.     Right: Normal. No bleeding, inverted nipple, mass, nipple discharge, skin change or tenderness.     Left: Normal. No bleeding, inverted nipple, mass, nipple discharge, skin change or tenderness.  Abdominal:     General: Bowel sounds are normal. There is no distension.     Palpations: Abdomen is soft. There is no mass.     Tenderness: There is no abdominal tenderness. There is no guarding or rebound.     Hernia: There is no hernia in the left inguinal area or right inguinal area.  Genitourinary:    General: Normal vulva.     Exam position: Lithotomy position.     Pubic Area: No rash.      Labia:        Right: No rash, tenderness, lesion or injury.        Left: No rash, tenderness, lesion or injury.      Vagina: Normal. No vaginal discharge, tenderness, bleeding, lesions or prolapsed vaginal walls.     Cervix: Normal. No cervical motion tenderness, discharge, friability, lesion, erythema, cervical bleeding or eversion.     Uterus: Normal.      Adnexa: Right adnexa normal and left adnexa normal.     Rectum: Normal.     Comments: PAP DONE Musculoskeletal:        General: Normal range of motion.     Cervical back: Normal range of motion and neck supple.     Right lower leg: No edema.     Left lower leg: No edema.  Lymphadenopathy:     Cervical: No cervical adenopathy.  Skin:    General: Skin is warm and dry.     Findings: No erythema or rash.  Neurological:     General: No focal deficit present.     Mental Status: She is alert and oriented to person, place, and  time.     Cranial Nerves: No cranial nerve deficit.     Deep Tendon Reflexes: Reflexes are normal and symmetric.  Psychiatric:        Mood and Affect: Mood normal.        Behavior: Behavior normal.        Thought Content: Thought content normal.        Judgment: Judgment normal.      No results found for any visits on 02/26/23.  Last CBC Lab Results  Component Value Date   WBC 7.2 01/04/2021   HGB 13.6 01/04/2021   HCT 40.1 01/04/2021   MCV 92.1 01/04/2021   MCH 29.9 06/02/2011   RDW 12.9 01/04/2021   PLT 274.0 01/04/2021   Last metabolic panel Lab Results  Component Value Date   GLUCOSE 85 01/04/2021   NA 139 01/04/2021   K 4.2 01/04/2021   CL 102 01/04/2021   CO2 26 01/04/2021   BUN 13 01/04/2021   CREATININE 0.83 01/04/2021   GFR 92.24 01/04/2021   CALCIUM 9.4 01/04/2021   PROT 7.5 01/04/2021   ALBUMIN 4.3 01/04/2021   BILITOT 0.4 01/04/2021   ALKPHOS 41 01/04/2021   AST 16 01/04/2021   ALT 11 01/04/2021   Last lipids Lab Results  Component Value Date   CHOL 177 01/04/2021   HDL 51.70 01/04/2021   LDLCALC 94 01/04/2021   LDLDIRECT 163.5 08/12/2013   TRIG 158.0 (H) 01/04/2021   CHOLHDL 3 01/04/2021   Last hemoglobin A1c No results found for: "HGBA1C" Last thyroid  functions Lab Results  Component Value Date   TSH 1.37 01/04/2021   Last vitamin D No results found for: "25OHVITD2", "25OHVITD3", "VD25OH" Last vitamin B12 and Folate Lab Results  Component Value Date   VITAMINB12 313 07/27/2009   FOLATE 12.6 07/27/2009    The ASCVD Risk score (Arnett DK, et al., 2019) failed to calculate for the following reasons:   The 2019 ASCVD risk score is only valid for ages 61 to 36    Assessment & Plan:   Problem List Items Addressed This Visit   None Visit Diagnoses     Preventative health care    -  Primary   Relevant Orders   CBC with Differential/Platelet   Comprehensive metabolic panel   Lipid panel   TSH   Encounter for screening and  preventative care       Relevant Orders   Cytology - PAP( [place])   Encounter for surveillance of contraceptive pills       Relevant Medications   Norgestimate-Ethinyl Estradiol Triphasic (TRI-SPRINTEC) 0.18/0.215/0.25 MG-35 MCG tablet   Screening mammogram for breast cancer       Relevant Orders   MM 3D SCREENING MAMMOGRAM BILATERAL BREAST     Assessment and Plan    Contraception Patient is currently on birth control and is satisfied with it. No side effects reported. -Refill birth control prescription at [facility] in [place].  Weight Loss Patient has lost 32 pounds since the beginning of the year through diet and exercise. -Encourage continuation of current diet and exercise regimen.  Breast Health Patient has a history of benign cysts. No recent mammogram on record. -Order baseline mammogram. Patient to schedule at radiology department.  General Health Maintenance Patient reports regular eye and dental check-ups. -Continue regular eye and dental check-ups.  Follow-up Patient to have labs done today.        No follow-ups on file.    Donato Schultz, DO

## 2023-02-27 LAB — COMPREHENSIVE METABOLIC PANEL
ALT: 15 U/L (ref 0–35)
AST: 16 U/L (ref 0–37)
Albumin: 4 g/dL (ref 3.5–5.2)
Alkaline Phosphatase: 32 U/L — ABNORMAL LOW (ref 39–117)
BUN: 10 mg/dL (ref 6–23)
CO2: 27 meq/L (ref 19–32)
Calcium: 9.1 mg/dL (ref 8.4–10.5)
Chloride: 102 meq/L (ref 96–112)
Creatinine, Ser: 0.81 mg/dL (ref 0.40–1.20)
GFR: 93.56 mL/min (ref >60.00)
Glucose, Bld: 91 mg/dL (ref 70–99)
Potassium: 4.3 meq/L (ref 3.5–5.1)
Sodium: 138 meq/L (ref 135–145)
Total Bilirubin: 0.4 mg/dL (ref 0.2–1.2)
Total Protein: 7 g/dL (ref 6.0–8.3)

## 2023-02-27 LAB — CBC WITH DIFFERENTIAL/PLATELET
Basophils Absolute: 0 10*3/uL (ref 0.0–0.1)
Basophils Relative: 0.7 % (ref 0.0–3.0)
Eosinophils Absolute: 0.1 10*3/uL (ref 0.0–0.7)
Eosinophils Relative: 0.9 % (ref 0.0–5.0)
HCT: 40.4 % (ref 36.0–46.0)
Hemoglobin: 13.2 g/dL (ref 12.0–15.0)
Lymphocytes Relative: 28.4 % (ref 12.0–46.0)
Lymphs Abs: 1.7 10*3/uL (ref 0.7–4.0)
MCHC: 32.6 g/dL (ref 30.0–36.0)
MCV: 93.8 fl (ref 78.0–100.0)
Monocytes Absolute: 0.3 10*3/uL (ref 0.1–1.0)
Monocytes Relative: 4.2 % (ref 3.0–12.0)
Neutro Abs: 4 10*3/uL (ref 1.4–7.7)
Neutrophils Relative %: 65.8 % (ref 43.0–77.0)
Platelets: 238 10*3/uL (ref 150.0–400.0)
RBC: 4.3 Mil/uL (ref 3.87–5.11)
RDW: 13.2 % (ref 11.5–15.5)
WBC: 6.1 10*3/uL (ref 4.0–10.5)

## 2023-02-27 LAB — LIPID PANEL
Cholesterol: 183 mg/dL (ref 0–200)
HDL: 55 mg/dL (ref >39.00)
LDL Cholesterol: 94 mg/dL (ref 0–99)
NonHDL: 127.56
Total CHOL/HDL Ratio: 3
Triglycerides: 167 mg/dL — ABNORMAL HIGH (ref 0.0–149.0)
VLDL: 33.4 mg/dL (ref 0.0–40.0)

## 2023-02-27 LAB — TSH: TSH: 0.88 u[IU]/mL (ref 0.35–5.50)

## 2023-03-08 LAB — CYTOLOGY - PAP
Comment: NEGATIVE
Diagnosis: UNDETERMINED — AB
High risk HPV: NEGATIVE

## 2023-03-19 ENCOUNTER — Telehealth (HOSPITAL_BASED_OUTPATIENT_CLINIC_OR_DEPARTMENT_OTHER): Payer: Self-pay

## 2023-12-27 ENCOUNTER — Ambulatory Visit: Admitting: Family Medicine

## 2024-01-20 ENCOUNTER — Other Ambulatory Visit: Payer: Self-pay | Admitting: Family Medicine

## 2024-01-20 DIAGNOSIS — Z3041 Encounter for surveillance of contraceptive pills: Secondary | ICD-10-CM

## 2024-04-12 ENCOUNTER — Other Ambulatory Visit: Payer: Self-pay | Admitting: Family Medicine

## 2024-04-12 DIAGNOSIS — Z3041 Encounter for surveillance of contraceptive pills: Secondary | ICD-10-CM

## 2024-05-23 ENCOUNTER — Encounter: Payer: Self-pay | Admitting: Family Medicine

## 2024-05-23 ENCOUNTER — Ambulatory Visit: Admitting: Family Medicine

## 2024-05-23 VITALS — BP 110/80 | HR 101 | Temp 97.8°F | Resp 16 | Ht 62.0 in | Wt 150.8 lb

## 2024-05-23 DIAGNOSIS — N941 Unspecified dyspareunia: Secondary | ICD-10-CM

## 2024-05-23 DIAGNOSIS — Z3041 Encounter for surveillance of contraceptive pills: Secondary | ICD-10-CM

## 2024-05-23 MED ORDER — NORGESTIM-ETH ESTRAD TRIPHASIC 0.18/0.215/0.25 MG-35 MCG PO TABS: 1.0000 | ORAL_TABLET | Freq: Every day | ORAL | 5 refills | Status: AC

## 2024-05-23 NOTE — Progress Notes (Signed)
 Subjective:    Patient ID: Margaret Horton, female    DOB: Jul 29, 1985, 38 y.o.   MRN: [id number]  Chief Complaint  Patient presents with   Sexual Problem    Pt states having pain during sex and going on for a while    HPI Patient is in today for pain during intercourse.  Discussed the use of AI scribe software for clinical note transcription with the patient, who gave verbal consent to proceed.  History of Present Illness Margaret Horton is a 38 year old female who presents with dyspareunia.  She experiences pain during intercourse consistently, describing it as occurring 'all the time.' There is no associated bleeding following intercourse.  She does not currently have a gynecologist. Her mother suggested she see one.  She has a pit bull dog, which she adopted last year.  No flu shots have been received.    Past Medical History:  Diagnosis Date   Anxiety    Attention deficit disorder (ADD)     No past surgical history on file.  Family History  Problem Relation Age of Onset   Hypertension Father    Kidney cancer Father    Cancer Father 64       lyphoma   Hypertension Paternal Grandfather    Diabetes Paternal Grandfather    Lymphoma Other     Social History   Socioeconomic History   Marital status: Married    Spouse name: Not on file   Number of children: Not on file   Years of education: Not on file   Highest education level: Not on file  Occupational History   Occupation: [employer]  Tobacco Use   Smoking status: Former    Current packs/day: 0.00    Average packs/day: 0.1 packs/day for 4.0 years (0.4 ttl pk-yrs)    Types: Cigarettes    Start date: 01/30/2007    Quit date: 01/30/2011    Years since quitting: 13.3   Smokeless tobacco: Never  Substance and Sexual Activity   Alcohol use: Yes    Alcohol/week: 1.0 standard drink of alcohol    Types: 1 drink(s) per week    Comment: occ   Drug use: No   Sexual activity: Yes    Partners: Male    Birth  control/protection: Pill  Other Topics Concern   Not on file  Social History Narrative   Exercising 20 min a day   Social Drivers of Corporate Investment Banker Strain: Not on file  Food Insecurity: Not on file  Transportation Needs: Not on file  Physical Activity: Not on file  Stress: Not on file  Social Connections: Not on file  Intimate Partner Violence: Not on file    Outpatient Medications Prior to Visit  Medication Sig Dispense Refill   Norgestimate-Ethinyl Estradiol Triphasic (TRI-ESTARYLLA) 0.18/0.215/0.25 MG-35 MCG tablet Take 1 tablet by mouth daily. Needs appt 28 tablet 0   No facility-administered medications prior to visit.    No Known Allergies  Review of Systems  Constitutional:  Negative for fever and malaise/fatigue.  HENT:  Negative for congestion.   Eyes:  Negative for blurred vision.  Respiratory:  Negative for shortness of breath.   Cardiovascular:  Negative for chest pain, palpitations and leg swelling.  Gastrointestinal:  Negative for abdominal pain, blood in stool and nausea.  Genitourinary:  Negative for dysuria and frequency.  Musculoskeletal:  Negative for falls.  Skin:  Negative for rash.  Neurological:  Negative for dizziness, loss of consciousness and headaches.  Endo/Heme/Allergies:  Negative for environmental allergies.  Psychiatric/Behavioral:  Negative for depression. The patient is not nervous/anxious.        Objective:    Physical Exam Vitals and nursing note reviewed.  Constitutional:      General: She is not in acute distress.    Appearance: Normal appearance. She is well-developed.  HENT:     Head: Normocephalic and atraumatic.  Eyes:     General: No scleral icterus.       Right eye: No discharge.        Left eye: No discharge.  Cardiovascular:     Rate and Rhythm: Normal rate and regular rhythm.     Heart sounds: No murmur heard. Pulmonary:     Effort: Pulmonary effort is normal. No respiratory distress.     Breath  sounds: Normal breath sounds.  Musculoskeletal:        General: Normal range of motion.     Cervical back: Normal range of motion and neck supple.     Right lower leg: No edema.     Left lower leg: No edema.  Skin:    General: Skin is warm and dry.  Neurological:     Mental Status: She is alert and oriented to person, place, and time.  Psychiatric:        Mood and Affect: Mood normal.        Behavior: Behavior normal.        Thought Content: Thought content normal.        Judgment: Judgment normal.     BP 110/80 (BP Location: Left Arm, Patient Position: Sitting, Cuff Size: Normal)   Pulse (!) 101   Temp 97.8 F (36.6 C) (Oral)   Resp 16   Ht 5' 2 (1.575 m)   Wt 150 lb 12.8 oz (68.4 kg)   LMP 05/12/2024   SpO2 97%   BMI 27.58 kg/m  Wt Readings from Last 3 Encounters:  05/23/24 150 lb 12.8 oz (68.4 kg)  02/26/23 145 lb 12.8 oz (66.1 kg)  09/11/21 162 lb (73.5 kg)    Diabetic Foot Exam - Simple   No data filed    Lab Results  Component Value Date   WBC 6.1 02/26/2023   HGB 13.2 02/26/2023   HCT 40.4 02/26/2023   PLT 238.0 02/26/2023   GLUCOSE 91 02/26/2023   CHOL 183 02/26/2023   TRIG 167.0 (H) 02/26/2023   HDL 55.00 02/26/2023   LDLDIRECT 163.5 08/12/2013   LDLCALC 94 02/26/2023   ALT 15 02/26/2023   AST 16 02/26/2023   NA 138 02/26/2023   K 4.3 02/26/2023   CL 102 02/26/2023   CREATININE 0.81 02/26/2023   BUN 10 02/26/2023   CO2 27 02/26/2023   TSH 0.88 02/26/2023    Lab Results  Component Value Date   TSH 0.88 02/26/2023   Lab Results  Component Value Date   WBC 6.1 02/26/2023   HGB 13.2 02/26/2023   HCT 40.4 02/26/2023   MCV 93.8 02/26/2023   PLT 238.0 02/26/2023   Lab Results  Component Value Date   NA 138 02/26/2023   K 4.3 02/26/2023   CO2 27 02/26/2023   GLUCOSE 91 02/26/2023   BUN 10 02/26/2023   CREATININE 0.81 02/26/2023   BILITOT 0.4 02/26/2023   ALKPHOS 32 (L) 02/26/2023   AST 16 02/26/2023   ALT 15 02/26/2023   PROT 7.0  02/26/2023   ALBUMIN 4.0 02/26/2023   CALCIUM 9.1 02/26/2023   GFR 93.56  02/26/2023   Lab Results  Component Value Date   CHOL 183 02/26/2023   Lab Results  Component Value Date   HDL 55.00 02/26/2023   Lab Results  Component Value Date   LDLCALC 94 02/26/2023   Lab Results  Component Value Date   TRIG 167.0 (H) 02/26/2023   Lab Results  Component Value Date   CHOLHDL 3 02/26/2023   No results found for: HGBA1C     Assessment & Plan:  Dyspareunia in female -     Ambulatory referral to Obstetrics / Gynecology  Encounter for surveillance of contraceptive pills -     Norgestim-Eth Estrad Triphasic; Take 1 tablet by mouth daily. Needs appt  Dispense: 28 tablet; Refill: 5   Assessment and Plan Assessment & Plan Dyspareunia   Chronic dyspareunia occurs during intercourse without post-coital bleeding. Referred to [facility] for evaluation by a female provider.  Contraceptive management   She requests a birth control refill and has no current desire for pregnancy. Refilled birth control prescription.   Augusten Lipkin R Lowne Chase, DO
